# Patient Record
Sex: Male | Born: 1970 | Race: White | Hispanic: No | Marital: Married | Smoking: Never smoker
Health system: Southern US, Community
[De-identification: ages and names within clinical notes are randomized; demographics above are authoritative.]

## PROBLEM LIST (undated history)

## (undated) DIAGNOSIS — E785 Hyperlipidemia, unspecified: Secondary | ICD-10-CM

## (undated) DIAGNOSIS — IMO0002 Reserved for concepts with insufficient information to code with codable children: Secondary | ICD-10-CM

## (undated) DIAGNOSIS — I10 Essential (primary) hypertension: Secondary | ICD-10-CM

## (undated) DIAGNOSIS — E1165 Type 2 diabetes mellitus with hyperglycemia: Secondary | ICD-10-CM

## (undated) DIAGNOSIS — G4700 Insomnia, unspecified: Secondary | ICD-10-CM

## (undated) DIAGNOSIS — K219 Gastro-esophageal reflux disease without esophagitis: Secondary | ICD-10-CM

## (undated) DIAGNOSIS — T7840XA Allergy, unspecified, initial encounter: Secondary | ICD-10-CM

## (undated) HISTORY — DX: Gastro-esophageal reflux disease without esophagitis: K21.9

## (undated) HISTORY — DX: Hyperlipidemia, unspecified: E78.5

## (undated) HISTORY — DX: Reserved for concepts with insufficient information to code with codable children: IMO0002

## (undated) HISTORY — DX: Allergy, unspecified, initial encounter: T78.40XA

## (undated) HISTORY — DX: Essential (primary) hypertension: I10

## (undated) HISTORY — DX: Type 2 diabetes mellitus with hyperglycemia: E11.65

## (undated) HISTORY — DX: Insomnia, unspecified: G47.00

## (undated) HISTORY — PX: OTHER SURGICAL HISTORY: SHX169

---

## 2011-06-28 ENCOUNTER — Emergency Department: Payer: Self-pay | Admitting: Emergency Medicine

## 2011-06-28 LAB — CBC
MCH: 30.9 pg (ref 26.0–34.0)
MCHC: 33.9 g/dL (ref 32.0–36.0)
MCV: 91 fL (ref 80–100)
Platelet: 307 10*3/uL (ref 150–440)

## 2011-06-28 LAB — COMPREHENSIVE METABOLIC PANEL
Albumin: 4.4 g/dL (ref 3.4–5.0)
BUN: 14 mg/dL (ref 7–18)
Bilirubin,Total: 1.3 mg/dL — ABNORMAL HIGH (ref 0.2–1.0)
Calcium, Total: 9.6 mg/dL (ref 8.5–10.1)
Co2: 25 mmol/L (ref 21–32)
Glucose: 141 mg/dL — ABNORMAL HIGH (ref 65–99)
Osmolality: 282 (ref 275–301)
Potassium: 3.6 mmol/L (ref 3.5–5.1)
SGPT (ALT): 41 U/L
Sodium: 140 mmol/L (ref 136–145)

## 2011-06-28 LAB — LIPASE, BLOOD: Lipase: 179 U/L (ref 73–393)

## 2011-06-28 LAB — TROPONIN I: Troponin-I: <0.02 ng/mL

## 2013-02-07 DIAGNOSIS — M25569 Pain in unspecified knee: Secondary | ICD-10-CM | POA: Insufficient documentation

## 2013-03-24 ENCOUNTER — Ambulatory Visit: Payer: Self-pay | Admitting: Physician Assistant

## 2013-03-28 ENCOUNTER — Ambulatory Visit: Payer: Self-pay | Admitting: Physician Assistant

## 2013-04-07 ENCOUNTER — Ambulatory Visit: Payer: Self-pay | Admitting: Physician Assistant

## 2013-04-10 ENCOUNTER — Ambulatory Visit: Payer: Self-pay | Admitting: Family Medicine

## 2013-05-14 LAB — LIPID PANEL
BUN: 10
Cholesterol, Total: 231
Creat: 0.82
GLUCOSE: 136
HDL: 33 mg/dL — AB (ref 35–70)
LDL CALC: 119 mg/dL
POTASSIUM: 4.4 mmol/L
Sodium: 140
Triglycerides: 395

## 2013-09-14 LAB — HEMOGLOBIN A1C, FINGERSTICK
Hemoglobin A1C: 7.4
Microalb, Ur: 20

## 2014-01-17 LAB — HEMOGLOBIN A1C, FINGERSTICK

## 2014-03-18 ENCOUNTER — Ambulatory Visit: Payer: Self-pay | Admitting: Family Medicine

## 2014-04-04 LAB — LIPID PANEL
BUN: 8
Cholesterol, Total: 228
Creat: 0.81
Glucose: 172
HDL: 32 mg/dL — AB (ref 35–70)
Potassium: 4.4 mmol/L
Sodium: 140
Triglycerides: 550

## 2014-05-21 LAB — BASIC METABOLIC PANEL
BUN: 11 mg/dL (ref 4–21)
CREATININE: 1 mg/dL (ref 0.6–1.3)
Glucose: 228 mg/dL
POTASSIUM: 4.2 mmol/L (ref 3.4–5.3)
Sodium: 137 mmol/L (ref 137–147)

## 2014-05-21 LAB — LIPID PANEL
Cholesterol: 193 mg/dL (ref 0–200)
HDL: 33 mg/dL — AB (ref 35–70)
Total CK: 63 U/L (ref ?–195.0)
Triglycerides: 574 mg/dL — AB (ref 40–160)

## 2014-05-21 LAB — HEMOGLOBIN A1C: Hgb A1c MFr Bld: 8.7 % — AB (ref 4.0–6.0)

## 2014-05-21 LAB — HEPATIC FUNCTION PANEL: ALT: 38 U/L (ref 10–40)

## 2014-07-24 LAB — GLUCOSE (CC13): GLUCOSE: 380

## 2014-08-06 ENCOUNTER — Encounter: Payer: Self-pay | Admitting: *Deleted

## 2014-08-06 DIAGNOSIS — I10 Essential (primary) hypertension: Secondary | ICD-10-CM | POA: Insufficient documentation

## 2014-08-06 DIAGNOSIS — K219 Gastro-esophageal reflux disease without esophagitis: Secondary | ICD-10-CM | POA: Insufficient documentation

## 2014-08-06 DIAGNOSIS — G4700 Insomnia, unspecified: Secondary | ICD-10-CM | POA: Insufficient documentation

## 2014-08-06 DIAGNOSIS — E782 Mixed hyperlipidemia: Secondary | ICD-10-CM | POA: Insufficient documentation

## 2014-08-06 DIAGNOSIS — J302 Other seasonal allergic rhinitis: Secondary | ICD-10-CM | POA: Insufficient documentation

## 2014-08-06 DIAGNOSIS — E119 Type 2 diabetes mellitus without complications: Secondary | ICD-10-CM

## 2014-08-06 DIAGNOSIS — E1169 Type 2 diabetes mellitus with other specified complication: Secondary | ICD-10-CM | POA: Insufficient documentation

## 2014-08-06 DIAGNOSIS — E669 Obesity, unspecified: Secondary | ICD-10-CM | POA: Insufficient documentation

## 2014-08-08 ENCOUNTER — Encounter: Payer: Self-pay | Admitting: Family Medicine

## 2014-08-09 ENCOUNTER — Ambulatory Visit (INDEPENDENT_AMBULATORY_CARE_PROVIDER_SITE_OTHER): Payer: BC Managed Care – PPO | Admitting: Family Medicine

## 2014-08-09 ENCOUNTER — Encounter: Payer: Self-pay | Admitting: Family Medicine

## 2014-08-09 VITALS — BP 110/70 | HR 80 | Temp 98.7°F | Resp 16 | Wt 242.0 lb

## 2014-08-09 DIAGNOSIS — E782 Mixed hyperlipidemia: Secondary | ICD-10-CM

## 2014-08-09 DIAGNOSIS — E1165 Type 2 diabetes mellitus with hyperglycemia: Secondary | ICD-10-CM

## 2014-08-09 DIAGNOSIS — I10 Essential (primary) hypertension: Secondary | ICD-10-CM | POA: Diagnosis not present

## 2014-08-09 DIAGNOSIS — IMO0002 Reserved for concepts with insufficient information to code with codable children: Secondary | ICD-10-CM

## 2014-08-09 NOTE — Progress Notes (Signed)
   Patient: David Baldwin Male    DOB: 06/23/1970   44 y.o.   MRN: [id number] Visit Date: 08/09/2014  Today's Provider: Mila Merryonald Paulette Rockford, MD   No chief complaint on file.  Subjective:    Diabetes He presents for his follow-up diabetic visit. He has type 2 diabetes mellitus. Hypoglycemia symptoms include sleepiness. Pertinent negatives for hypoglycemia include no dizziness or headaches. Associated symptoms include fatigue and visual change. Pertinent negatives for diabetes include no chest pain. Current diabetic treatment includes oral agent (dual therapy). He is compliant with treatment all of the time. His weight is stable. His breakfast blood glucose range is generally 180-200 mg/dl.       Diabetes Mellitus Type II, Follow-up:   Lab Results  Component Value Date   HGBA1C 8.7* 05/21/2014    Last seen for diabetes 2 weeks ago seen by Nadine CountsBob. Management changes included starting Glipizide 10mg  bid. He reports good compliance with treatment. He is not having side effects.  Current symptoms include none and have been stable. Home blood sugar records: fasting range: 185  Episodes of hypoglycemia? no   Weight trend: stable  He states that urinary frequency has greatly improved since starting glipizide, but is still having a lot of trouble with blurry vision.   Pertinent Labs:    Component Value Date/Time   CHOL 193 05/21/2014   CHOL 228 04/04/2014   TRIG 574* 05/21/2014   TRIG 550 04/04/2014   CREATININE 1.0 05/21/2014   CREATININE 0.81 04/04/2014   CREATININE 0.91 06/28/2011 1922    Wt Readings from Last 3 Encounters:  07/24/14 235 lb 3.2 oz (106.686 kg)        Previous Medications   GLIPIZIDE (GLUCOTROL) 10 MG TABLET    Take 1 tablet by mouth. Take 1 Tablet Po 30 Minutes Before Breakfast And Supper   GLUCOSE BLOOD TEST STRIP    1 strip by Other route daily.   LISINOPRIL-HYDROCHLOROTHIAZIDE (PRINZIDE,ZESTORETIC) 20-25 MG PER TABLET    Take 1 tablet by mouth daily.   METFORMIN (GLUCOPHAGE-XR) 500 MG 24 HR TABLET    Take 1 tablet by mouth 2 (two) times daily.   NAPROXEN SODIUM 220 MG CAPS    Take 1 capsule by mouth daily as needed.   PRAVASTATIN (PRAVACHOL) 80 MG TABLET    Take 1 tablet by mouth daily.    Review of Systems  Constitutional: Positive for fatigue.  Cardiovascular: Negative for chest pain.  Neurological: Negative for dizziness and headaches.    History  Substance Use Topics  . Smoking status: Never Smoker   . Smokeless tobacco: Not on file  . Alcohol Use: No   Objective:   There were no vitals filed for this visit.  Physical Exam  Constitutional: He appears well-developed and well-nourished.  Cardiovascular: Normal rate and regular rhythm.   Pulmonary/Chest: Effort normal and breath sounds normal.        Assessment & Plan:     1. Diabetes type 2, uncontrolled Symptomatically improved on glipizide, but is not checking sugars during the day.   - ALT - Renal Function Panel - Hemoglobin A1c  2. Essential hypertension well controlled Continue current medications.    3. Hyperlipemia, mixed He is tolerating pravastatin well with no adverse effects.    - Lipid panel

## 2014-08-21 LAB — RENAL FUNCTION PANEL
ALBUMIN: 4.4 g/dL (ref 3.5–5.5)
BUN / CREAT RATIO: 13 (ref 9–20)
BUN: 10 mg/dL (ref 6–24)
CO2: 24 mmol/L (ref 18–29)
CREATININE: 0.78 mg/dL (ref 0.76–1.27)
Calcium: 10.3 mg/dL — ABNORMAL HIGH (ref 8.7–10.2)
Chloride: 99 mmol/L (ref 97–108)
GFR calc Af Amer: 126 mL/min/{1.73_m2} (ref >59)
GFR calc non Af Amer: 109 mL/min/{1.73_m2} (ref >59)
Glucose: 142 mg/dL — ABNORMAL HIGH (ref 65–99)
PHOSPHORUS: 3.4 mg/dL (ref 2.5–4.5)
Potassium: 4.2 mmol/L (ref 3.5–5.2)
Sodium: 141 mmol/L (ref 134–144)

## 2014-08-21 LAB — LIPID PANEL
Chol/HDL Ratio: 5.1 ratio units — ABNORMAL HIGH (ref 0.0–5.0)
Cholesterol, Total: 178 mg/dL (ref 100–199)
HDL: 35 mg/dL — ABNORMAL LOW (ref >39)
LDL CALC: 86 mg/dL (ref 0–99)
Triglycerides: 284 mg/dL — ABNORMAL HIGH (ref 0–149)
VLDL Cholesterol Cal: 57 mg/dL — ABNORMAL HIGH (ref 5–40)

## 2014-08-21 LAB — HEMOGLOBIN A1C
Est. average glucose Bld gHb Est-mCnc: 275 mg/dL
Hgb A1c MFr Bld: 11.2 % — ABNORMAL HIGH (ref 4.8–5.6)

## 2014-08-21 LAB — ALT: ALT: 35 IU/L (ref 0–44)

## 2014-08-22 ENCOUNTER — Telehealth: Payer: Self-pay | Admitting: Family Medicine

## 2014-08-22 NOTE — Telephone Encounter (Signed)
Pt contacted office for refill request on the following medications:glipiZIDE (GLUCOTROL) 10 MG tablet.  [facility] [address]  CB#[phone]/MJ

## 2014-08-23 MED ORDER — GLIPIZIDE 10 MG PO TABS: ORAL_TABLET | ORAL | Status: DC

## 2014-08-23 NOTE — Telephone Encounter (Signed)
done

## 2014-09-18 ENCOUNTER — Ambulatory Visit (INDEPENDENT_AMBULATORY_CARE_PROVIDER_SITE_OTHER): Payer: BC Managed Care – PPO | Admitting: Family Medicine

## 2014-09-18 ENCOUNTER — Encounter: Payer: Self-pay | Admitting: Family Medicine

## 2014-09-18 VITALS — BP 112/80 | HR 72 | Temp 98.5°F | Resp 16 | Wt 240.0 lb

## 2014-09-18 DIAGNOSIS — E1165 Type 2 diabetes mellitus with hyperglycemia: Secondary | ICD-10-CM | POA: Diagnosis not present

## 2014-09-18 DIAGNOSIS — E782 Mixed hyperlipidemia: Secondary | ICD-10-CM | POA: Diagnosis not present

## 2014-09-18 DIAGNOSIS — IMO0002 Reserved for concepts with insufficient information to code with codable children: Secondary | ICD-10-CM

## 2014-09-18 NOTE — Progress Notes (Signed)
Patient: David Baldwin Male    DOB: 1970/08/14   44 y.o.   MRN: [id number] Visit Date: 09/18/2014  Today's Provider: Mila Merry, MD   Chief Complaint  Patient presents with  . Diabetes  . Hyperlipidemia   Subjective:    Diabetes He presents for his follow-up diabetic visit. He has type 2 diabetes mellitus. His disease course has been improving. There are no hypoglycemic associated symptoms. Pertinent negatives for hypoglycemia include no dizziness, headaches, seizures, speech difficulty or tremors. Associated symptoms include blurred vision (Pt reports his vision has improved greatly. ), foot paresthesias, foot ulcerations and visual change. Pertinent negatives for diabetes include no chest pain, no fatigue, no polydipsia, no polyphagia, no polyuria, no weakness and no weight loss. Risk factors for coronary artery disease include diabetes mellitus, hypertension and dyslipidemia. Current diabetic treatment includes oral agent (dual therapy). He is compliant with treatment all of the time. He is following a diabetic diet. His home blood glucose trend is decreasing steadily. His overall blood glucose range is 140-180 mg/dl. He does not see a podiatrist.Eye exam is current.  Hyperlipidemia This is a chronic problem. The problem is uncontrolled. Recent lipid tests were reviewed and are high. Pertinent negatives include no chest pain or shortness of breath. There are no compliance problems.  Risk factors for coronary artery disease include diabetes mellitus, dyslipidemia and hypertension.   He started glipizide after last visit about a month ago when sugars were running in the upper 200s. He feels much better since starting glipizide with no hypoglycemia. His vision has stabilized and is back to baseline.     Lab Results  Component Value Date   HGBA1C 11.2* 08/20/2014   Lab Results  Component Value Date   CHOL 178 08/20/2014   HDL 35* 08/20/2014   LDLCALC 86 08/20/2014   TRIG  284* 08/20/2014   CHOLHDL 5.1* 08/20/2014    Allergies  Allergen Reactions  . Zolpidem Tartrate Other (See Comments)   Previous Medications   GLIPIZIDE (GLUCOTROL) 10 MG TABLET    Take 1 Tablet Before Breakfast And Supper   GLUCOSE BLOOD TEST STRIP    1 strip by Other route daily.   LISINOPRIL-HYDROCHLOROTHIAZIDE (PRINZIDE,ZESTORETIC) 20-25 MG PER TABLET    Take 1 tablet by mouth daily.   METFORMIN (GLUCOPHAGE-XR) 500 MG 24 HR TABLET    Take 1 tablet by mouth 2 (two) times daily.   NAPROXEN SODIUM 220 MG CAPS    Take 1 capsule by mouth daily as needed.   PRAVASTATIN (PRAVACHOL) 80 MG TABLET    Take 1 tablet by mouth daily.    Review of Systems  Constitutional: Negative for fever, chills, weight loss, diaphoresis, activity change, appetite change, fatigue and unexpected weight change.  Eyes: Positive for blurred vision (Pt reports his vision has improved greatly. ).  Respiratory: Negative for apnea, cough, choking, chest tightness, shortness of breath, wheezing and stridor.   Cardiovascular: Negative for chest pain and palpitations.  Gastrointestinal: Negative for nausea, vomiting, abdominal pain, diarrhea, constipation, blood in stool, abdominal distention, anal bleeding and rectal pain.  Endocrine: Negative for cold intolerance, heat intolerance, polydipsia, polyphagia and polyuria.  Neurological: Negative for dizziness, tremors, seizures, syncope, facial asymmetry, speech difficulty, weakness, light-headedness, numbness and headaches.    History  Substance Use Topics  . Smoking status: Never Smoker   . Smokeless tobacco: Not on file  . Alcohol Use: No   Objective:   BP 112/80 mmHg  Pulse 72  Temp(Src) 98.5 F (36.9 C) (Oral)  Resp 16  Wt 240 lb (108.863 kg)  Physical Exam   General Appearance:    Alert, cooperative, no distress  Eyes:    PERRL, conjunctiva/corneas clear, EOM's intact       Lungs:     Clear to auscultation bilaterally, respirations unlabored  Heart:     Regular rate and rhythm  Neurologic:   Awake, alert, oriented x 3. No apparent focal neurological           defect.           Assessment & Plan:      1. Hyperli2emia, mixed He is tolerating pravastatin well with no adverse effects.    2. Diabetes type 2, uncontrolled Home glucose much better since adding glipizide with improvement in sx. Continue current medications.  Recheck A1c at follow up in 2-3 months.   Follow up: Return in about 2 months (around 11/19/2014).      Mila Merryonald Fisher, MD  [facility]

## 2014-10-08 ENCOUNTER — Other Ambulatory Visit: Payer: Self-pay | Admitting: Family Medicine

## 2014-10-08 MED ORDER — GLIPIZIDE 10 MG PO TABS: ORAL_TABLET | ORAL | Status: DC

## 2014-10-08 MED ORDER — PRAVASTATIN SODIUM 80 MG PO TABS: 80.0000 mg | ORAL_TABLET | Freq: Every day | ORAL | Status: DC

## 2014-10-08 MED ORDER — METFORMIN HCL ER 500 MG PO TB24: 500.0000 mg | ORAL_TABLET | Freq: Two times a day (BID) | ORAL | Status: DC

## 2014-10-08 MED ORDER — LISINOPRIL-HYDROCHLOROTHIAZIDE 20-25 MG PO TABS: 1.0000 | ORAL_TABLET | Freq: Every day | ORAL | Status: DC

## 2014-11-07 ENCOUNTER — Ambulatory Visit: Payer: BC Managed Care – PPO | Admitting: Family Medicine

## 2014-11-18 ENCOUNTER — Encounter: Payer: Self-pay | Admitting: Family Medicine

## 2014-11-18 ENCOUNTER — Ambulatory Visit (INDEPENDENT_AMBULATORY_CARE_PROVIDER_SITE_OTHER): Payer: BC Managed Care – PPO | Admitting: Family Medicine

## 2014-11-18 VITALS — BP 120/78 | HR 84 | Temp 98.9°F | Resp 16 | Ht 68.0 in | Wt 246.0 lb

## 2014-11-18 DIAGNOSIS — E1165 Type 2 diabetes mellitus with hyperglycemia: Secondary | ICD-10-CM

## 2014-11-18 DIAGNOSIS — E782 Mixed hyperlipidemia: Secondary | ICD-10-CM

## 2014-11-18 DIAGNOSIS — I10 Essential (primary) hypertension: Secondary | ICD-10-CM | POA: Diagnosis not present

## 2014-11-18 DIAGNOSIS — IMO0002 Reserved for concepts with insufficient information to code with codable children: Secondary | ICD-10-CM

## 2014-11-18 LAB — POCT GLYCOSYLATED HEMOGLOBIN (HGB A1C)
ESTIMATED AVERAGE GLUCOSE: 143
HEMOGLOBIN A1C: 6.6

## 2014-11-18 MED ORDER — METFORMIN HCL ER 500 MG PO TB24: 500.0000 mg | ORAL_TABLET | Freq: Two times a day (BID) | ORAL | Status: DC

## 2014-11-18 MED ORDER — LISINOPRIL-HYDROCHLOROTHIAZIDE 20-25 MG PO TABS: 1.0000 | ORAL_TABLET | Freq: Every day | ORAL | Status: DC

## 2014-11-18 MED ORDER — PRAVASTATIN SODIUM 80 MG PO TABS: 80.0000 mg | ORAL_TABLET | Freq: Every day | ORAL | Status: DC

## 2014-11-18 MED ORDER — GLIPIZIDE 10 MG PO TABS: ORAL_TABLET | ORAL | Status: DC

## 2014-11-18 NOTE — Progress Notes (Signed)
Patient: David Baldwin Male    DOB: 06-28-1970   44 y.o.   MRN: [id number] Visit Date: 11/18/2014  Today's Provider: Mila Merry, MD   Chief Complaint  Patient presents with  . Diabetes    follow up   Subjective:    HPI      Diabetes Mellitus Type II, Follow-up:   Lab Results  Component Value Date   HGBA1C 6.6 11/18/2014   HGBA1C 11.2* 08/20/2014   HGBA1C 8.7* 05/21/2014    Last seen for diabetes 3 months ago.  He reports good compliance with treatment. He is not having side effects.  Current symptoms include hypoglycemia  and have been improving. Home blood sugar records: fasting range: 140's  Episodes of hypoglycemia? yes - blood sugar was 40 last week, but felt fine. No other low sugar readings.    Current Insulin Regimen:  none Most Recent Eye Exam:  > 1 year. [facility] Weight trend: stable Prior visit with dietician: no Current diet: in general, a "healthy" diet   Current exercise: walking (at work)  Pertinent Labs:    Component Value Date/Time   CHOL 178 08/20/2014 0814   CHOL 193 05/21/2014   CHOL 228 04/04/2014   TRIG 284* 08/20/2014 0814   TRIG 550 04/04/2014   CHOLHDL 5.1* 08/20/2014 0814   CREATININE 0.78 08/20/2014 0814   CREATININE 1.0 05/21/2014   CREATININE 0.81 04/04/2014   CREATININE 0.91 06/28/2011 1922    Wt Readings from Last 3 Encounters:  09/18/14 240 lb (108.863 kg)  08/09/14 242 lb (109.77 kg)  07/24/14 235 lb 3.2 oz (106.686 kg)    ------------------------------------------------------------------------   Hypertension, follow-up:  BP Readings from Last 3 Encounters:  11/18/14 120/78  09/18/14 112/80  08/09/14 110/70    He was last seen for hypertension 3 months ago.  BP at that visit was  112/80. Management since that visit includes  none. He reports good compliance with treatment. He is not having side effects.  He is exercising. He is adherent to low salt diet.   Outside blood pressures are   Normal per patient. He is experiencing none.  Patient denies chest pain, chest pressure/discomfort, claudication, dyspnea, exertional chest pressure/discomfort, fatigue, irregular heart beat, lower extremity edema, near-syncope, orthopnea, palpitations, paroxysmal nocturnal dyspnea, syncope and tachypnea.   Cardiovascular risk factors include dyslipidemia, hypertension and male gender.  Use of agents associated with hypertension: none.     Weight trend: stable Wt Readings from Last 3 Encounters:  11/18/14 246 lb (111.585 kg)  09/18/14 240 lb (108.863 kg)  08/09/14 242 lb (109.77 kg)    Current diet: in general, a "healthy" diet    ------------------------------------------------------------------------    Allergies  Allergen Reactions  . Zolpidem Tartrate Other (See Comments)   Previous Medications   GLIPIZIDE (GLUCOTROL) 10 MG TABLET    Take 1 Tablet Before Breakfast And Supper   GLUCOSE BLOOD TEST STRIP    1 strip by Other route daily.   LISINOPRIL-HYDROCHLOROTHIAZIDE (PRINZIDE,ZESTORETIC) 20-25 MG PER TABLET    Take 1 tablet by mouth daily.   METFORMIN (GLUCOPHAGE-XR) 500 MG 24 HR TABLET    Take 1 tablet (500 mg total) by mouth 2 (two) times daily.   NAPROXEN SODIUM 220 MG CAPS    Take 1 capsule by mouth daily as needed.   PRAVASTATIN (PRAVACHOL) 80 MG TABLET    Take 1 tablet (80 mg total) by mouth daily.    Review of Systems  Constitutional: Negative  for fever, chills and appetite change.  Respiratory: Negative for chest tightness, shortness of breath and wheezing.   Cardiovascular: Negative for chest pain and palpitations.  Gastrointestinal: Negative for nausea, vomiting and abdominal pain.    Social History  Substance Use Topics  . Smoking status: Never Smoker   . Smokeless tobacco: Not on file  . Alcohol Use: No   Objective:   BP 120/78 mmHg  Pulse 84  Temp(Src) 98.9 F (37.2 C) (Oral)  Resp 16  Ht  (1.727 m)  Wt 246 lb (111.585 kg)  BMI 37.41 kg/m2   SpO2 96%  Physical Exam  Results for orders placed or performed in visit on 11/18/14  POCT HgB A1C  Result Value Ref Range   Hemoglobin A1C 6.6    Est. average glucose Bld gHb Est-mCnc 143        Assessment & Plan:     1. Diabetes type 2, uncontrolled Greatly improved. Try to reduce glipizide to one every morning. Advised he may take the second dose in the evening if he sees evening sugars going up into the 200s.  - POCT HgB A1C - glipiZIDE (GLUCOTROL) 10 MG tablet; Take 1 Tablet Before Breakfast And one before Supper as needed  Dispense: 60 tablet; Refill: 5 - metFORMIN (GLUCOPHAGE-XR) 500 MG 24 hr tablet; Take 1 tablet (500 mg total) by mouth 2 (two) times daily.  Dispense: 60 tablet; Refill: 5  2. Hyperlipemia, mixed He is tolerating pravastatin well with no adverse effects.   - pravastatin (PRAVACHOL) 80 MG tablet; Take 1 tablet (80 mg total) by mouth daily.  Dispense: 30 tablet; Refill: 6  3. Essential hypertension well controlled. Continue current medications.   - lisinopril-hydrochlorothiazide (PRINZIDE,ZESTORETIC) 20-25 MG per tablet; Take 1 tablet by mouth daily.  Dispense: 30 tablet; Refill: 6       Mila Merry, MD  [facility]

## 2015-02-17 ENCOUNTER — Encounter: Payer: Self-pay | Admitting: Family Medicine

## 2015-02-17 ENCOUNTER — Ambulatory Visit (INDEPENDENT_AMBULATORY_CARE_PROVIDER_SITE_OTHER): Payer: BC Managed Care – PPO | Admitting: Family Medicine

## 2015-02-17 VITALS — BP 124/84 | HR 94 | Temp 99.3°F | Resp 16 | Ht 68.0 in | Wt 251.0 lb

## 2015-02-17 DIAGNOSIS — J069 Acute upper respiratory infection, unspecified: Secondary | ICD-10-CM | POA: Diagnosis not present

## 2015-02-17 DIAGNOSIS — R208 Other disturbances of skin sensation: Secondary | ICD-10-CM

## 2015-02-17 DIAGNOSIS — E1165 Type 2 diabetes mellitus with hyperglycemia: Secondary | ICD-10-CM

## 2015-02-17 DIAGNOSIS — IMO0001 Reserved for inherently not codable concepts without codable children: Secondary | ICD-10-CM

## 2015-02-17 DIAGNOSIS — R200 Anesthesia of skin: Secondary | ICD-10-CM

## 2015-02-17 LAB — POCT GLYCOSYLATED HEMOGLOBIN (HGB A1C)
ESTIMATED AVERAGE GLUCOSE: 151
HEMOGLOBIN A1C: 6.9

## 2015-02-17 NOTE — Progress Notes (Signed)
Patient: David Baldwin Male    DOB: 1971/01/25   44 y.o.   MRN: [id number] Visit Date: 02/17/2015  Today's Provider: Mila Merry, MD   Chief Complaint  Patient presents with  . Follow-up  . Diabetes  . Hyperlipidemia  . Hypertension   Subjective:    HPI   Diabetes Mellitus Type II, Follow-up:   Lab Results  Component Value Date   HGBA1C 6.6 11/18/2014   HGBA1C 11.2* 08/20/2014   HGBA1C 8.7* 05/21/2014   Last seen for diabetes 4 months ago.  Management since then includes; advised to try to reduce glipizide to 1 every morning. However he started having blurry vision on one glipizide a day so has since gone back up to  Twice a day.  He reports good compliance with treatment. He is not having side effects. none Current symptoms include none and have been stable. Home blood sugar records: fasting range: 140  Episodes of hypoglycemia? no   Current Insulin Regimen: n/a Most Recent Eye Exam: due Weight trend: stable Prior visit with dietician: no Current diet: well balanced Current exercise: none  ----------------------------------------------------------------------   Hypertension, follow-up:  BP Readings from Last 3 Encounters:  11/18/14 120/78  09/18/14 112/80  08/09/14 110/70    He was last seen for hypertension 4 months ago.  BP at that visit was 120/78. Management since that visit includes; no changes.He reports good compliance with treatment. He is not having side effects. none  He is not exercising. He is not adherent to low salt diet.   Outside blood pressures are 120/70. He is experiencing none.  Patient denies none.   Cardiovascular risk factors include diabetes Use of agents associated with hypertension: none.   ----------------------------------------------------------------------   URI Patient has had nasal and chest congestion since 02/08/2015. Has had low-grade fever. Feels much better over the last 3 days. Sore throat has  resolved  Left arm pain States his left arm starting hurting about 2 months ago. Not sure how he injured it. Has not been swollen, but feels weak and numb at times. Is slowly improving.     Allergies  Allergen Reactions  . Zolpidem Tartrate Other (See Comments)   Previous Medications   GLIPIZIDE (GLUCOTROL) 10 MG TABLET    Take 1 Tablet Before Breakfast And one before Supper as needed   GLUCOSE BLOOD TEST STRIP    1 strip by Other route daily.   LISINOPRIL-HYDROCHLOROTHIAZIDE (PRINZIDE,ZESTORETIC) 20-25 MG PER TABLET    Take 1 tablet by mouth daily.   METFORMIN (GLUCOPHAGE-XR) 500 MG 24 HR TABLET    Take 1 tablet (500 mg total) by mouth 2 (two) times daily.   NAPROXEN SODIUM 220 MG CAPS    Take 1 capsule by mouth daily as needed.   PRAVASTATIN (PRAVACHOL) 80 MG TABLET    Take 1 tablet (80 mg total) by mouth daily.    Review of Systems  Constitutional: Positive for fever. Negative for chills and appetite change.  HENT: Positive for congestion and postnasal drip. Negative for ear discharge, ear pain, sinus pressure and sore throat.   Respiratory: Positive for cough. Negative for chest tightness, shortness of breath and wheezing.   Cardiovascular: Negative for chest pain and palpitations.  Gastrointestinal: Negative for nausea, vomiting and abdominal pain.    Social History  Substance Use Topics  . Smoking status: Never Smoker   . Smokeless tobacco: Not on file  . Alcohol Use: No   Objective:   BP 124/84  mmHg  Pulse 94  Temp(Src) 99.3 F (37.4 C) (Oral)  Resp 16  Ht 5\' 8"  (1.727 m)  Wt 251 lb (113.853 kg)  BMI 38.17 kg/m2  SpO2 97%  Physical Exam  General Appearance:    Alert, cooperative, no distress  HENT:   bilateral TM normal without fluid or infection, neck without nodes, sinuses nontender and nasal mucosa congested  Eyes:    PERRL, conjunctiva/corneas clear, EOM's intact       Lungs:     Clear to auscultation bilaterally, respirations unlabored  Heart:    Regular  rate and rhythm  Neurologic:   Awake, alert, oriented x 3. No apparent focal neurological           defect.         Results for orders placed or performed in visit on 02/17/15  POCT glycosylated hemoglobin (Hb A1C)  Result Value Ref Range   Hemoglobin A1C 6.9    Est. average glucose Bld gHb Est-mCnc 151        Assessment & Plan:     1. Controlled type 2 diabetes mellitus without complication, without long-term current use of insulin (HCC) Doing well current medications.  - POCT glycosylated hemoglobin (Hb A1C)  2. Upper respiratory infection Improving.  Counseled regarding signs and symptoms of viral and bacterial respiratory infections. Advised to call or return for additional evaluation if he develops any sign of bacterial infection, or if current symptoms last longer than 10 days.    3. Left arm numbness Slowly improving. Advised to call for orthopedic referral if not completely better within a month  Follow up  4 months.        Mila Merryonald Aadith Raudenbush, MD  [facility]

## 2015-05-13 LAB — HM DIABETES EYE EXAM

## 2015-05-15 ENCOUNTER — Encounter: Payer: Self-pay | Admitting: *Deleted

## 2015-05-29 ENCOUNTER — Other Ambulatory Visit: Payer: Self-pay | Admitting: Family Medicine

## 2015-05-29 DIAGNOSIS — E782 Mixed hyperlipidemia: Secondary | ICD-10-CM

## 2015-05-29 MED ORDER — GLIPIZIDE 10 MG PO TABS: ORAL_TABLET | ORAL | Status: DC

## 2015-05-29 MED ORDER — PRAVASTATIN SODIUM 80 MG PO TABS: 80.0000 mg | ORAL_TABLET | Freq: Every day | ORAL | Status: DC

## 2015-05-29 NOTE — Telephone Encounter (Signed)
Pt contacted office for refill request on the following medications: 90 day supply.  [facility] [address]  CB#[phone]/[place]  glipiZIDE (GLUCOTROL) 10 MG tablet  pravastatin (PRAVACHOL) 80 MG tablet

## 2015-05-29 NOTE — Telephone Encounter (Signed)
Requesting 90 day supply.

## 2015-06-23 ENCOUNTER — Ambulatory Visit: Payer: BC Managed Care – PPO | Admitting: Family Medicine

## 2015-06-23 ENCOUNTER — Encounter: Payer: Self-pay | Admitting: Family Medicine

## 2015-06-23 ENCOUNTER — Ambulatory Visit (INDEPENDENT_AMBULATORY_CARE_PROVIDER_SITE_OTHER): Payer: BC Managed Care – PPO | Admitting: Family Medicine

## 2015-06-23 VITALS — BP 104/80 | HR 87 | Temp 98.2°F | Resp 16 | Ht 68.0 in | Wt 254.0 lb

## 2015-06-23 DIAGNOSIS — E782 Mixed hyperlipidemia: Secondary | ICD-10-CM | POA: Diagnosis not present

## 2015-06-23 DIAGNOSIS — E1165 Type 2 diabetes mellitus with hyperglycemia: Secondary | ICD-10-CM

## 2015-06-23 DIAGNOSIS — I10 Essential (primary) hypertension: Secondary | ICD-10-CM

## 2015-06-23 DIAGNOSIS — G4700 Insomnia, unspecified: Secondary | ICD-10-CM

## 2015-06-23 DIAGNOSIS — IMO0001 Reserved for inherently not codable concepts without codable children: Secondary | ICD-10-CM

## 2015-06-23 LAB — POCT GLYCOSYLATED HEMOGLOBIN (HGB A1C)
ESTIMATED AVERAGE GLUCOSE: 166
Hemoglobin A1C: 7.4

## 2015-06-23 MED ORDER — LORAZEPAM 1 MG PO TABS: 1.0000 mg | ORAL_TABLET | Freq: Every evening | ORAL | Status: DC | PRN

## 2015-06-23 MED ORDER — LISINOPRIL-HYDROCHLOROTHIAZIDE 20-25 MG PO TABS: 1.0000 | ORAL_TABLET | Freq: Every day | ORAL | Status: DC

## 2015-06-23 MED ORDER — METFORMIN HCL ER 500 MG PO TB24: 500.0000 mg | ORAL_TABLET | Freq: Two times a day (BID) | ORAL | Status: DC

## 2015-06-23 MED ORDER — LORAZEPAM 1 MG PO TABS: 1.0000 mg | ORAL_TABLET | Freq: Two times a day (BID) | ORAL | Status: DC | PRN

## 2015-06-23 NOTE — Progress Notes (Signed)
Patient: David Baldwin Male    DOB: 04/25/1970   45 y.o.   MRN: [id number] Visit Date: 06/23/2015  Today's Provider: Mila Merryonald Fisher, MD   Chief Complaint  Patient presents with  . Follow-up  . Diabetes  . Hypertension  . Hyperlipidemia   Subjective:    HPI   Diabetes Mellitus Type II, Follow-up:   Lab Results  Component Value Date   HGBA1C 6.9 02/17/2015   HGBA1C 6.6 11/18/2014   HGBA1C 11.2* 08/20/2014   Last seen for diabetes 4 months ago.  Management since then includes; no changes. He reports fair compliance with treatment. He is not having side effects. none Current symptoms include none and have been stable. Home blood sugar records: fasting range: none  Episodes of hypoglycemia? no   Current Insulin Regimen: n/a Most Recent Eye Exam: 3/17  Weight trend: stable Prior visit with dietician: no Current diet: in general, a "healthy" diet   Current exercise: walking  ----------------------------------------------------------------------     Hypertension, follow-up:  BP Readings from Last 3 Encounters:  06/23/15 104/80  02/17/15 124/84  11/18/14 120/78    He was last seen for hypertension 7 months ago.  BP at that visit was 120/78. Management since that visit includes; no changes.He reports good compliance with treatment. He is not having side effects. none  He is exercising. He is adherent to low salt diet.   Outside blood pressures are normal. He is experiencing none.  Patient denies none.   Cardiovascular risk factors include diabetes mellitus.  Use of agents associated with hypertension: none.   ----------------------------------------------------------------------     Lipid/Cholesterol, Follow-up:   Last seen for this 7 months ago.  Management since that visit includes; no changes.  Last Lipid Panel:    Component Value Date/Time   CHOL 178 08/20/2014 0814   CHOL 193 05/21/2014   CHOL 228 04/04/2014   TRIG 284* 08/20/2014  0814   TRIG 550 04/04/2014   HDL 35* 08/20/2014 0814   HDL 33* 05/21/2014   CHOLHDL 5.1* 08/20/2014 0814   LDLCALC 86 08/20/2014 0814   LDLCALC 119 05/14/2013    He reports good compliance with treatment. He is not having side effects. none  Wt Readings from Last 3 Encounters:  06/23/15 254 lb (115.214 kg)  02/17/15 251 lb (113.853 kg)  11/18/14 246 lb (111.585 kg)    ----------------------------------------------------------------------  Insomnia He would like to try a different medication to help wind down and sleep at night. He tried Ambien in the past which he did not tolerate. He has tried several OTC medications including melatonin, which either did not work or made him groggy the next morning.    Allergies  Allergen Reactions  . Zolpidem Tartrate Other (See Comments)   Previous Medications   GLIPIZIDE (GLUCOTROL) 10 MG TABLET    Take 1 Tablet Before Breakfast And one before Supper as needed   GLUCOSE BLOOD TEST STRIP    1 strip by Other route daily.   LISINOPRIL-HYDROCHLOROTHIAZIDE (PRINZIDE,ZESTORETIC) 20-25 MG PER TABLET    Take 1 tablet by mouth daily.   METFORMIN (GLUCOPHAGE-XR) 500 MG 24 HR TABLET    Take 1 tablet (500 mg total) by mouth 2 (two) times daily.   NAPROXEN SODIUM 220 MG CAPS    Take 1 capsule by mouth daily as needed.   PRAVASTATIN (PRAVACHOL) 80 MG TABLET    Take 1 tablet (80 mg total) by mouth daily.    Review of Systems  Constitutional:  Negative for fever, chills and appetite change.  Respiratory: Negative for chest tightness, shortness of breath and wheezing.   Cardiovascular: Negative for chest pain and palpitations.  Gastrointestinal: Negative for nausea, vomiting and abdominal pain.    Social History  Substance Use Topics  . Smoking status: Never Smoker   . Smokeless tobacco: Not on file  . Alcohol Use: No   Objective:   BP 104/80 mmHg  Pulse 87  Temp(Src) 98.2 F (36.8 C) (Oral)  Resp 16  Ht  (1.727 m)  Wt 254 lb (115.214  kg)  BMI 38.63 kg/m2  SpO2 96%  Physical Exam   General Appearance:    Alert, cooperative, no distress  Eyes:    PERRL, conjunctiva/corneas clear, EOM's intact       Lungs:     Clear to auscultation bilaterally, respirations unlabored  Heart:    Regular rate and rhythm  Neurologic:   Awake, alert, oriented x 3. No apparent focal neurological           defect.       .  Results for orders placed or performed in visit on 06/23/15  POCT glycosylated hemoglobin (Hb A1C)  Result Value Ref Range   Hemoglobin A1C 7.4    Est. average glucose Bld gHb Est-mCnc 166        Assessment & Plan:     1. Uncontrolled type 2 diabetes mellitus without complication, without long-term current use of insulin (HCC) A1c and weight are up a bit. He is going to work on improving diet exercising more and losing weight and reassess in 3 months.  - POCT glycosylated hemoglobin (Hb A1C) - metFORMIN (GLUCOPHAGE-XR) 500 MG 24 hr tablet; Take 1 tablet (500 mg total) by mouth 2 (two) times daily.  Dispense: 60 tablet; Refill: 5  2. Essential hypertension Well controlled.  Continue current medications.   - lisinopril-hydrochlorothiazide (PRINZIDE,ZESTORETIC) 20-25 MG tablet; Take 1 tablet by mouth daily.  Dispense: 30 tablet; Refill: 6  3. Insomnia Try lorazepam  - LORazepam (ATIVAN) 1 MG tablet; Take 1 tablet (1 mg total) by mouth at bedtime as needed for sleep.  Dispense: 20 tablet; Refill: 1  4. Hyperlipemia, mixed He is tolerating pravastatin well with no adverse effects.         Mila Merry, MD  [facility]

## 2015-09-25 ENCOUNTER — Ambulatory Visit (INDEPENDENT_AMBULATORY_CARE_PROVIDER_SITE_OTHER): Payer: BC Managed Care – PPO | Admitting: Family Medicine

## 2015-09-25 ENCOUNTER — Encounter: Payer: Self-pay | Admitting: Family Medicine

## 2015-09-25 VITALS — BP 130/90 | HR 80 | Temp 98.7°F | Resp 16 | Wt 254.0 lb

## 2015-09-25 DIAGNOSIS — E785 Hyperlipidemia, unspecified: Secondary | ICD-10-CM

## 2015-09-25 DIAGNOSIS — IMO0001 Reserved for inherently not codable concepts without codable children: Secondary | ICD-10-CM

## 2015-09-25 DIAGNOSIS — I10 Essential (primary) hypertension: Secondary | ICD-10-CM | POA: Diagnosis not present

## 2015-09-25 DIAGNOSIS — G4700 Insomnia, unspecified: Secondary | ICD-10-CM | POA: Diagnosis not present

## 2015-09-25 DIAGNOSIS — E1165 Type 2 diabetes mellitus with hyperglycemia: Secondary | ICD-10-CM

## 2015-09-25 LAB — POCT UA - MICROALBUMIN: Microalbumin Ur, POC: 20 mg/L

## 2015-09-25 LAB — POCT GLYCOSYLATED HEMOGLOBIN (HGB A1C)
Est. average glucose Bld gHb Est-mCnc: 220
HEMOGLOBIN A1C: 9.3

## 2015-09-25 MED ORDER — METFORMIN HCL ER 500 MG PO TB24: 1000.0000 mg | ORAL_TABLET | Freq: Two times a day (BID) | ORAL | Status: DC

## 2015-09-25 MED ORDER — LORAZEPAM 1 MG PO TABS: 0.5000 mg | ORAL_TABLET | Freq: Every evening | ORAL | Status: DC | PRN

## 2015-09-25 NOTE — Progress Notes (Signed)
Patient: David Baldwin Male    DOB: 1971/01/04   45 y.o.   MRN: [id number] Visit Date: 09/25/2015  Today's Provider: Mila Merry, MD   Chief Complaint  Patient presents with  . Hypertension    follow up  . Diabetes    follow up  . Hyperlipidemia    follow up  . Insomnia    follow up   Subjective:    HPI  Hypertension, follow-up:  BP Readings from Last 3 Encounters:  06/23/15 104/80  02/17/15 124/84  11/18/14 120/78    He was last seen for hypertension 3 months ago.  BP at that visit was 104/80. Management since that visit includes no changes. He reports good compliance with treatment. He is not having side effects.  He is exercising. He is adherent to low salt diet.   Outside blood pressures are not being checked. He is experiencing none.  Patient denies chest pain, chest pressure/discomfort, claudication, dyspnea, exertional chest pressure/discomfort, fatigue, irregular heart beat, lower extremity edema, near-syncope, orthopnea, palpitations, paroxysmal nocturnal dyspnea, syncope and tachypnea.   Cardiovascular risk factors include diabetes mellitus, dyslipidemia, hypertension and male gender.  Use of agents associated with hypertension: none.     Weight trend: stable Wt Readings from Last 3 Encounters:  09/25/15 254 lb (115.214 kg)  06/23/15 254 lb (115.214 kg)  02/17/15 251 lb (113.853 kg)    Current diet: in general, a "healthy" diet    ------------------------------------------------------------------------   Diabetes Mellitus Type II, Follow-up:   Lab Results  Component Value Date   HGBA1C 7.4 06/23/2015   HGBA1C 6.9 02/17/2015   HGBA1C 6.6 11/18/2014    Last seen for diabetes 3 months ago.  Management since then includes counseling patient to improve diet and exercise. He reports good compliance with treatment. He is not having side effects.  Current symptoms include none and have been stable. Home blood sugar records: not being  checked  Episodes of hypoglycemia? no   Current Insulin Regimen: none Most Recent Eye Exam: < 1 year Weight trend: stable Prior visit with dietician: no Current diet: in general, a "healthy" diet   Current exercise: walking  Pertinent Labs:    Component Value Date/Time   CHOL 178 08/20/2014 0814   CHOL 193 05/21/2014   CHOL 228 04/04/2014   TRIG 284* 08/20/2014 0814   TRIG 550 04/04/2014   HDL 35* 08/20/2014 0814   HDL 33* 05/21/2014   LDLCALC 86 08/20/2014 0814   LDLCALC 119 05/14/2013   CREATININE 0.78 08/20/2014 0814   CREATININE 1.0 05/21/2014   CREATININE 0.81 04/04/2014   CREATININE 0.91 06/28/2011 1922    Wt Readings from Last 3 Encounters:  09/25/15 254 lb (115.214 kg)  06/23/15 254 lb (115.214 kg)  02/17/15 251 lb (113.853 kg)    ------------------------------------------------------------------------   Lipid/Cholesterol, Follow-up:   Last seen for this3 months ago.  Management changes since that visit include no changes. . Last Lipid Panel:    Component Value Date/Time   CHOL 178 08/20/2014 0814   CHOL 193 05/21/2014   CHOL 228 04/04/2014   TRIG 284* 08/20/2014 0814   TRIG 550 04/04/2014   HDL 35* 08/20/2014 0814   HDL 33* 05/21/2014   CHOLHDL 5.1* 08/20/2014 0814   LDLCALC 86 08/20/2014 0814   LDLCALC 119 05/14/2013    Risk factors for vascular disease include diabetes mellitus, hypercholesterolemia and hypertension  He reports good compliance with treatment. He is not having side effects.  Current symptoms  include none and have been stable. Weight trend: stable Prior visit with dietician: no Current diet: in general, a "healthy" diet   Current exercise: walking  Wt Readings from Last 3 Encounters:  09/25/15 254 lb (115.214 kg)  06/23/15 254 lb (115.214 kg)  02/17/15 251 lb (113.853 kg)    ------------------------------------------------------------------- Follow up Insomnia:  Patient was last seen for this problem 3 months ago.  Changes made during this visit includes starting Lorazepam. Patient comes in today stating he has been out of Lorazepam for a couple of months. He reports when he was taking it, it worked well.     Allergies  Allergen Reactions  . Zolpidem Tartrate Other (See Comments)   Current Meds  Medication Sig  . glipiZIDE (GLUCOTROL) 10 MG tablet Take 1 Tablet Before Breakfast And one before Supper as needed  . glucose blood test strip 1 strip by Other route daily.  Marland Kitchen lisinopril-hydrochlorothiazide (PRINZIDE,ZESTORETIC) 20-25 MG tablet Take 1 tablet by mouth daily.  Marland Kitchen LORazepam (ATIVAN) 1 MG tablet Take 1 tablet (1 mg total) by mouth at bedtime as needed for sleep.  . metFORMIN (GLUCOPHAGE-XR) 500 MG 24 hr tablet Take 1 tablet (500 mg total) by mouth 2 (two) times daily.  . Naproxen Sodium 220 MG CAPS Take 1 capsule by mouth daily as needed.  . pravastatin (PRAVACHOL) 80 MG tablet Take 1 tablet (80 mg total) by mouth daily.    Review of Systems  Constitutional: Negative for fever, chills and appetite change.  Respiratory: Negative for chest tightness, shortness of breath and wheezing.   Cardiovascular: Negative for chest pain and palpitations.  Gastrointestinal: Negative for nausea, vomiting and abdominal pain.  Endocrine: Positive for polyuria. Negative for cold intolerance, heat intolerance, polydipsia and polyphagia.  Psychiatric/Behavioral: Positive for sleep disturbance.    Social History  Substance Use Topics  . Smoking status: Never Smoker   . Smokeless tobacco: Not on file  . Alcohol Use: No   Objective:   BP 130/90 mmHg  Pulse 80  Temp(Src) 98.7 F (37.1 C) (Oral)  Resp 16  Wt 254 lb (115.214 kg)  SpO2 96%  Physical Exam   General Appearance:    Alert, cooperative, no distress, obese  Eyes:    PERRL, conjunctiva/corneas clear, EOM's intact       Lungs:     Clear to auscultation bilaterally, respirations unlabored  Heart:    Regular rate and rhythm  Neurologic:   Awake,  alert, oriented x 3. No apparent focal neurological           defect.        Results for orders placed or performed in visit on 09/25/15  POCT HgB A1C  Result Value Ref Range   Hemoglobin A1C 9.3    Est. average glucose Bld gHb Est-mCnc 220   POCT UA - Microalbumin  Result Value Ref Range   Microalbumin Ur, POC 20 mg/L   Creatinine, POC n/a mg/dL   Albumin/Creatinine Ratio, Urine, POC n/a        Assessment & Plan:     1. Uncontrolled type 2 diabetes mellitus without complication, without long-term current use of insulin (HCC) Increase metformin from 1 bid to 2 bid. Counseled to check blood sugars more frequently, at least three times a week.   - POCT HgB A1C - POCT UA - Microalbumin - metFORMIN (GLUCOPHAGE-XR) 500 MG 24 hr tablet; Take 2 tablets (1,000 mg total) by mouth 2 (two) times daily. Please place prescription on hold until patient  calls for refill  Dispense: 120 tablet; Refill: 5 - Renal function panel  2. Insomnia Does well with lorazepam which we refilled today.  - LORazepam (ATIVAN) 1 MG tablet; Take 0.5-1 tablets (0.5-1 mg total) by mouth at bedtime as needed for sleep.  Dispense: 30 tablet; Refill: 2  3. Hyperlipidemia He is tolerating pravastatin well with no adverse effects.   - Lipid panel - Hepatic function panel  4. Essential hypertension Well controlled.  Continue current medications.    Return in about 3 months (around 12/26/2015).     The entirety of the information documented in the History of Present Illness, Review of Systems and Physical Exam were personally obtained by me. Portions of this information were initially documented by Awilda Billoshena Chambers, CMA and reviewed by me for thoroughness and accuracy.    Mila Merryonald Nemesio Castrillon, MD  [facility]

## 2015-09-26 LAB — HEPATIC FUNCTION PANEL
ALT: 36 IU/L (ref 0–44)
AST: 26 IU/L (ref 0–40)
Alkaline Phosphatase: 54 IU/L (ref 39–117)
BILIRUBIN TOTAL: 0.6 mg/dL (ref 0.0–1.2)
Bilirubin, Direct: 0.16 mg/dL (ref 0.00–0.40)
TOTAL PROTEIN: 7.3 g/dL (ref 6.0–8.5)

## 2015-09-26 LAB — RENAL FUNCTION PANEL
Albumin: 4.4 g/dL (ref 3.5–5.5)
BUN / CREAT RATIO: 15 (ref 9–20)
BUN: 11 mg/dL (ref 6–24)
CALCIUM: 9.9 mg/dL (ref 8.7–10.2)
CHLORIDE: 99 mmol/L (ref 96–106)
CO2: 21 mmol/L (ref 18–29)
CREATININE: 0.72 mg/dL — AB (ref 0.76–1.27)
GFR calc Af Amer: 128 mL/min/{1.73_m2} (ref >59)
GFR calc non Af Amer: 111 mL/min/{1.73_m2} (ref >59)
Glucose: 154 mg/dL — ABNORMAL HIGH (ref 65–99)
POTASSIUM: 4 mmol/L (ref 3.5–5.2)
Phosphorus: 3.9 mg/dL (ref 2.5–4.5)
SODIUM: 141 mmol/L (ref 134–144)

## 2015-09-26 LAB — LIPID PANEL
CHOL/HDL RATIO: 4.8 ratio (ref 0.0–5.0)
Cholesterol, Total: 155 mg/dL (ref 100–199)
HDL: 32 mg/dL — ABNORMAL LOW (ref >39)
LDL Calculated: 50 mg/dL (ref 0–99)
TRIGLYCERIDES: 363 mg/dL — AB (ref 0–149)
VLDL Cholesterol Cal: 73 mg/dL — ABNORMAL HIGH (ref 5–40)

## 2015-12-01 ENCOUNTER — Other Ambulatory Visit: Payer: Self-pay | Admitting: Family Medicine

## 2015-12-01 MED ORDER — GLIPIZIDE 10 MG PO TABS: ORAL_TABLET | ORAL | 0 refills | Status: DC

## 2015-12-01 NOTE — Telephone Encounter (Signed)
Pt needs refill on his glipazide.  [facility] [address]  Call back is [phone]  Thanks Barth Kirkseri

## 2015-12-01 NOTE — Telephone Encounter (Signed)
RX filled-aa 

## 2015-12-21 IMAGING — CR DG CHEST 2V
1 series · 2 of 2 positions shown · non-contrast
Comparison: 06/28/2011

CLINICAL DATA: Severe cough for 10 days.

EXAM:
CHEST  2 VIEW

[Series 1: kdxr chest pa (or ap) and lat · 0.14mm/px · 2 of 2 slices shown]
[im 1/2]
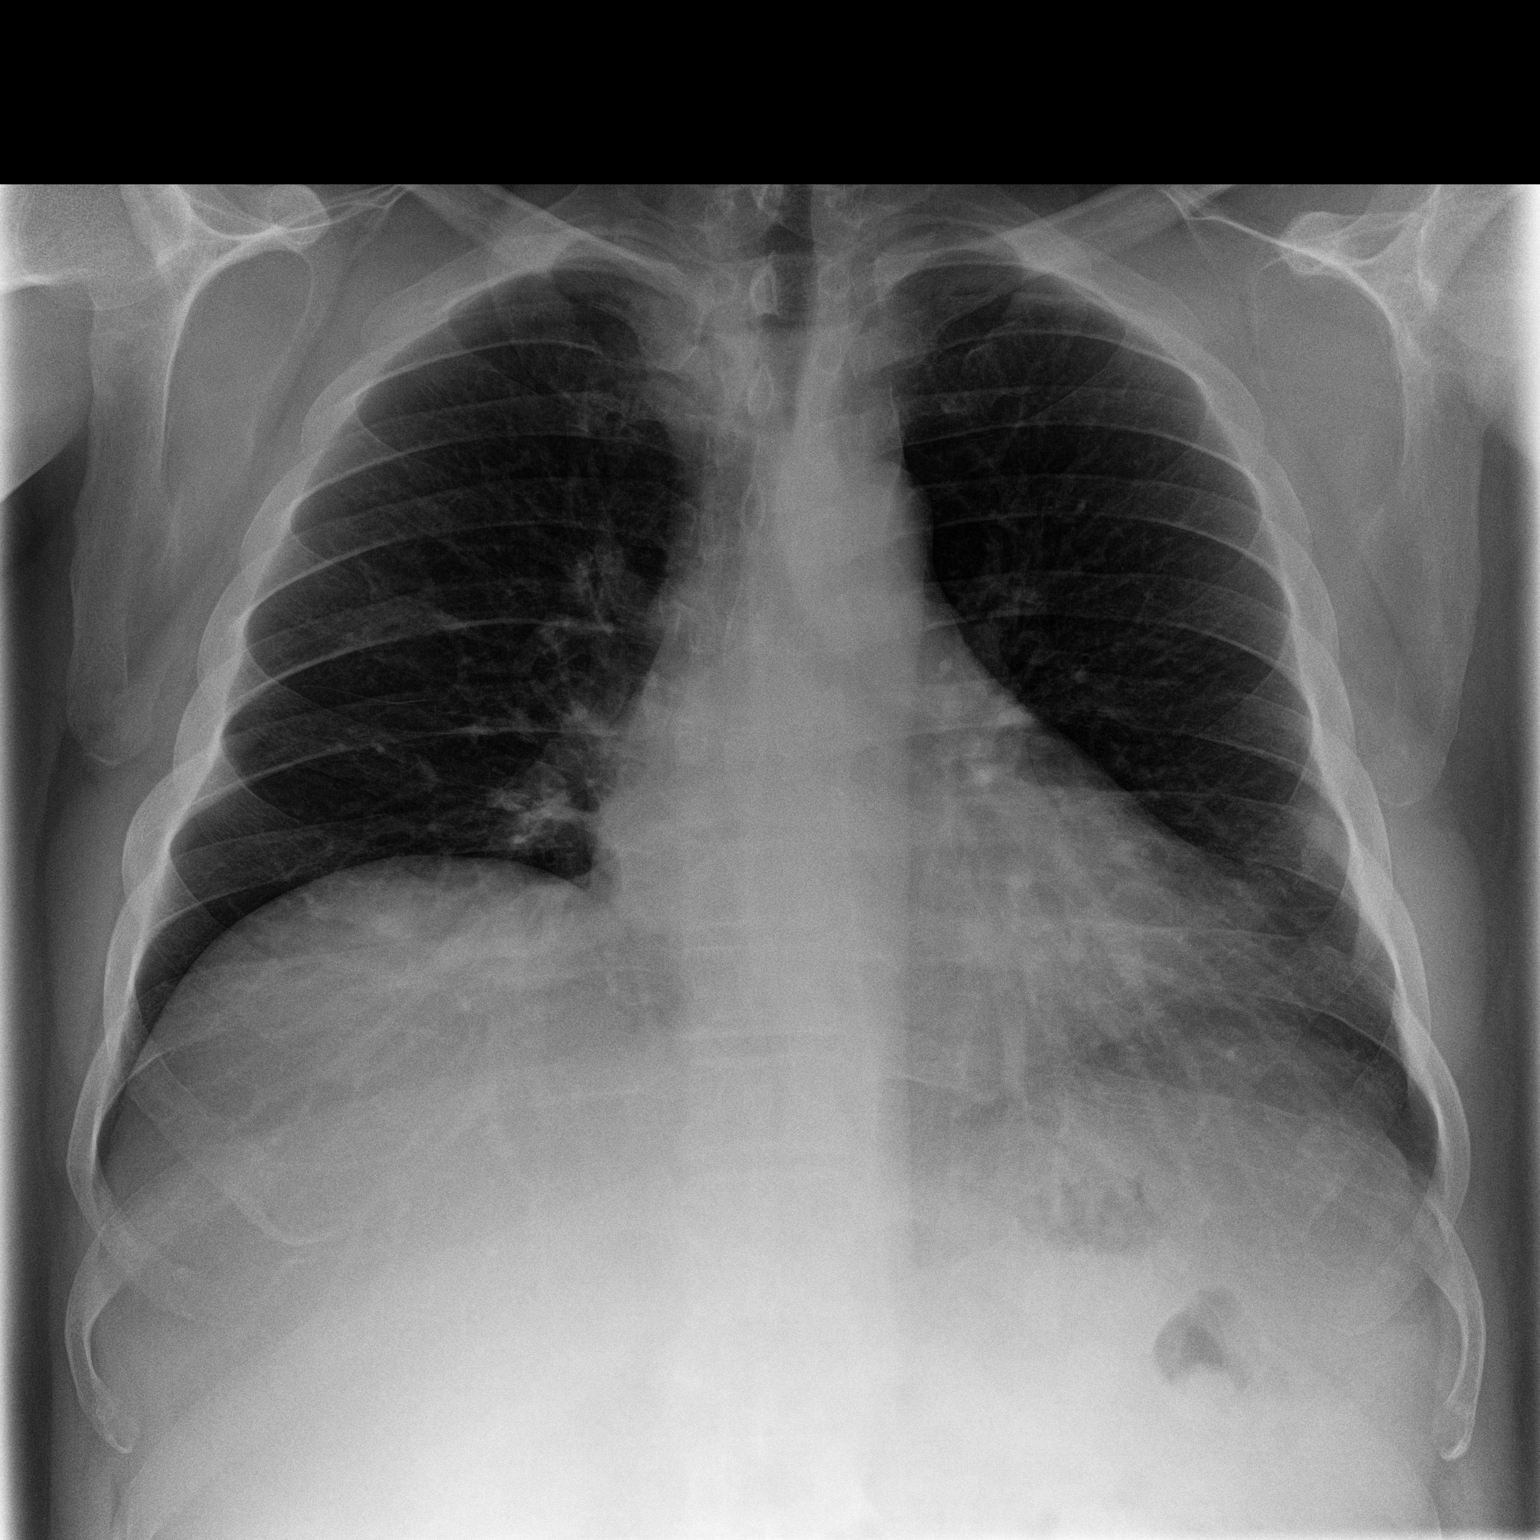
[im 2/2]
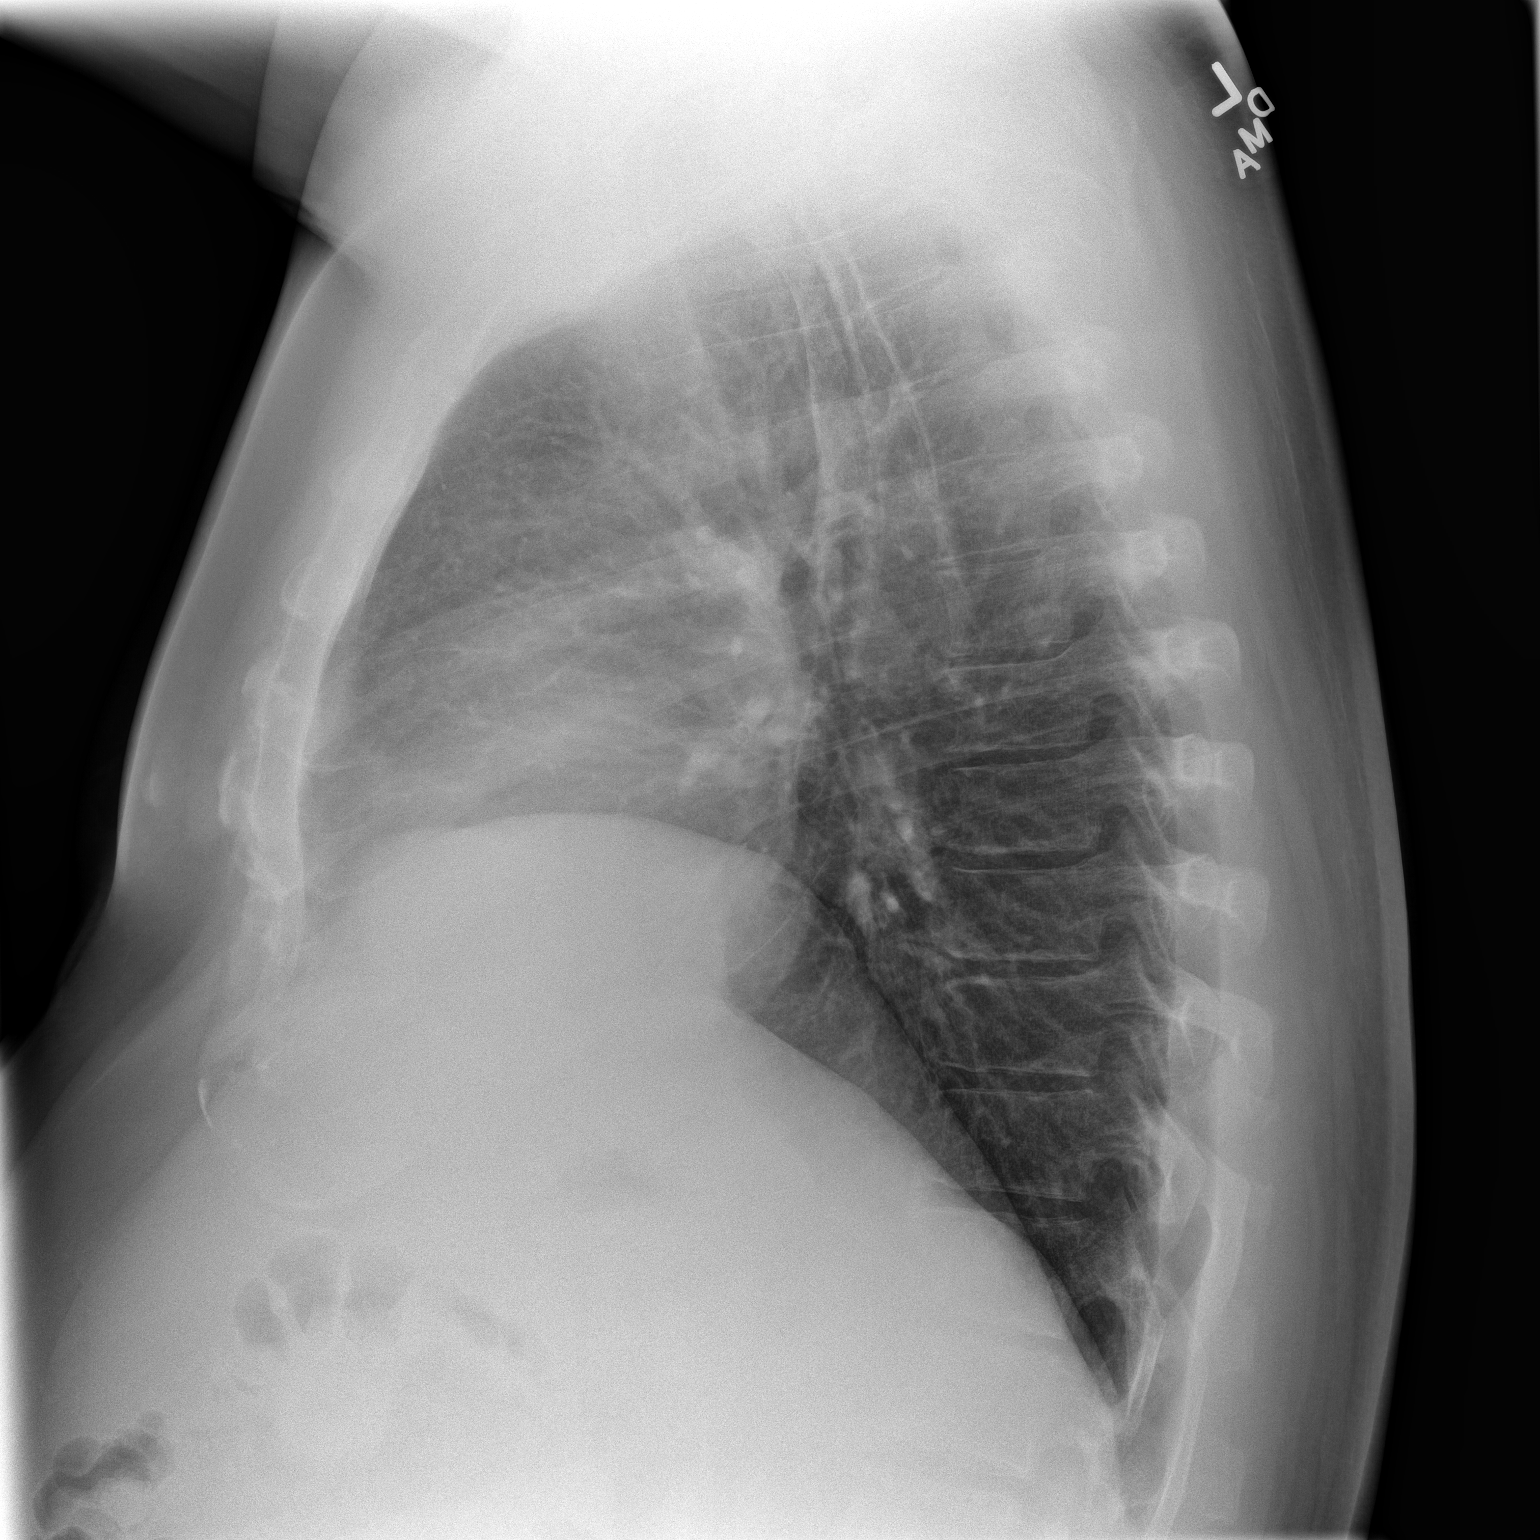

[2 of 2 positions shown; findings below may reference images not displayed]

FINDINGS: The heart size and mediastinal contours are within normal limits.
Both lungs are clear. The visualized skeletal structures are
unremarkable.
IMPRESSION: No active cardiopulmonary disease.

## 2016-01-06 ENCOUNTER — Ambulatory Visit (INDEPENDENT_AMBULATORY_CARE_PROVIDER_SITE_OTHER): Payer: BC Managed Care – PPO | Admitting: Family Medicine

## 2016-01-06 ENCOUNTER — Other Ambulatory Visit: Payer: Self-pay | Admitting: Family Medicine

## 2016-01-06 ENCOUNTER — Encounter: Payer: Self-pay | Admitting: Family Medicine

## 2016-01-06 VITALS — BP 126/82 | HR 84 | Temp 99.3°F | Resp 16 | Ht 68.0 in | Wt 250.0 lb

## 2016-01-06 DIAGNOSIS — F5101 Primary insomnia: Secondary | ICD-10-CM

## 2016-01-06 DIAGNOSIS — IMO0001 Reserved for inherently not codable concepts without codable children: Secondary | ICD-10-CM

## 2016-01-06 DIAGNOSIS — E1165 Type 2 diabetes mellitus with hyperglycemia: Secondary | ICD-10-CM | POA: Diagnosis not present

## 2016-01-06 LAB — POCT GLYCOSYLATED HEMOGLOBIN (HGB A1C)
Est. average glucose Bld gHb Est-mCnc: 209
HEMOGLOBIN A1C: 8.9

## 2016-01-06 MED ORDER — LORAZEPAM 1 MG PO TABS: 0.5000 mg | ORAL_TABLET | Freq: Every evening | ORAL | 2 refills | Status: DC | PRN

## 2016-01-06 MED ORDER — DAPAGLIFLOZIN PROPANEDIOL 10 MG PO TABS: 10.0000 mg | ORAL_TABLET | ORAL | 1 refills | Status: DC

## 2016-01-06 MED ORDER — DAPAGLIFLOZIN PROPANEDIOL 5 MG PO TABS
5.0000 mg | ORAL_TABLET | ORAL | 1 refills | Status: DC
Start: 2016-01-06 — End: 2016-03-16

## 2016-01-06 NOTE — Patient Instructions (Signed)
Dapagliflozin tablets What is this medicine? DAPAGLIFLOZIN (DAP a gli FLOE zin) helps to treat type 2 diabetes. It helps to control blood sugar. Treatment is combined with diet and exercise. This medicine may be used for other purposes; ask your health care provider or pharmacist if you have questions. What should I tell my health care provider before I take this medicine? They need to know if you have any of these conditions: -bladder cancer -dehydration -diabetic ketoacidosis -diet low in salt -eating less due to illness, surgery, dieting, or any other reason -having surgery -high cholesterol -history of pancreatitis or pancreas problems -history of yeast infection of the penis or vagina -if you often drink alcohol -infections in the bladder, kidneys, or urinary tract -kidney disease -low blood pressure -on hemodialysis -problems urinating -type 1 diabetes -uncircumcised male -an unusual or allergic reaction to dapagliflozin, other medicines, foods, dyes, or preservatives -pregnant or trying to get pregnant -breast-feeding How should I use this medicine? Take this medicine by mouth with a glass of water. Follow the directions on the prescription label. You can take it with or without food. If it upsets your stomach, take it with food. Take this medicine in the morning. Take your dose at the same time each day. Do not take more often than directed. Do not stop taking except on your doctor's advice. A special MedGuide will be given to you by the pharmacist with each prescription and refill. Be sure to read this information carefully each time. Talk to your pediatrician regarding the use of this medicine in children. Special care may be needed. Overdosage: If you think you have taken too much of this medicine contact a poison control center or emergency room at once. NOTE: This medicine is only for you. Do not share this medicine with others. What if I miss a dose? If you miss a dose,  take it as soon as you can. If it is almost time for your next dose, take only that dose. Do not take double or extra doses. What may interact with this medicine? Do not take this medicine with any of the following medications: -gatifloxacinThis medicine may also interact with the following medications: -alcohol -certain medicines for blood pressure, heart disease -diuretics -insulin -nateglinide -pioglitazone -quinolone antibiotics like ciprofloxacin, levofloxacin, ofloxacin -repaglinide -some herbal dietary supplements -steroid medicines like prednisone or cortisone -sulfonylureas like glimepiride, glipizide, glyburide -thyroid medicine This list may not describe all possible interactions. Give your health care provider a list of all the medicines, herbs, non-prescription drugs, or dietary supplements you use. Also tell them if you smoke, drink alcohol, or use illegal drugs. Some items may interact with your medicine. What should I watch for while using this medicine? Visit your doctor or health care professional for regular checks on your progress. This medicine can cause a serious condition in which there is too much acid in the blood. If you develop nausea, vomiting, stomach pain, unusual tiredness, or breathing problems, stop taking this medicine and call your doctor right away. If possible, use a ketone dipstick to check for ketones in your urine. A test called the HbA1C (A1C) will be monitored. This is a simple blood test. It measures your blood sugar control over the last 2 to 3 months. You will receive this test every 3 to 6 months. Learn how to check your blood sugar. Learn the symptoms of low and high blood sugar and how to manage them. Always carry a quick-source of sugar with you in case you   have symptoms of low blood sugar. Examples include hard sugar candy or glucose tablets. Make sure others know that you can choke if you eat or drink when you develop serious symptoms of low  blood sugar, such as seizures or unconsciousness. They must get medical help at once. Tell your doctor or health care professional if you have high blood sugar. You might need to change the dose of your medicine. If you are sick or exercising more than usual, you might need to change the dose of your medicine. Do not skip meals. Ask your doctor or health care professional if you should avoid alcohol. Many nonprescription cough and cold products contain sugar or alcohol. These can affect blood sugar. Wear a medical ID bracelet or chain, and carry a card that describes your disease and details of your medicine and dosage times. What side effects may I notice from receiving this medicine? Side effects that you should report to your doctor or health care professional as soon as possible: -allergic reactions like skin rash, itching or hives, swelling of the face, lips, or tongue -breathing problems -dizziness -feeling faint or lightheaded, falls -muscle weakness -nausea, vomiting, unusual stomach upset or pain -signs and symptoms of low blood sugar such as feeling anxious, confusion, dizziness, increased hunger, unusually weak or tired, sweating, shakiness, cold, irritable, headache, blurred vision, fast heartbeat, loss of consciousness -signs and symptoms of a urinary tract infection, such as fever, chills, a burning feeling when urinating, blood in the urine, back pain -trouble passing urine or change in the amount of urine, including an urgent need to urinate more often, in larger amounts, or at night -penile discharge, itching, or pain in men -unusual tiredness -vaginal discharge, itching, or odor in women Side effects that usually do not require medical attention (Report these to your doctor or health care professional if they continue or are bothersome.): -constipation -mild increase in urination -stuffy or runny nose -sore throat -thirsty This list may not describe all possible side  effects. Call your doctor for medical advice about side effects. You may report side effects to FDA at 1-800-FDA-1088. Where should I keep my medicine? Keep out of the reach of children. Store at room temperature between 15 and 30 degrees C (59 and 86 degrees F). Throw away any unused medicine after the expiration date. NOTE: This sheet is a summary. It may not cover all possible information. If you have questions about this medicine, talk to your doctor, pharmacist, or health care provider.    2016, Elsevier/Gold Standard. (2014-02-12 11:14:42)  

## 2016-01-06 NOTE — Progress Notes (Signed)
Patient: David Baldwin Male    DOB: 04/26/1970   45 y.o.   MRN: [id number] Visit Date: 01/06/2016  Today's Provider: Mila Merryonald Maili Shutters, MD   Chief Complaint  Patient presents with  . Diabetes  . Hyperlipidemia  . Insomnia   Subjective:    HPI  Diabetes Mellitus Type II, Follow-up:   Lab Results  Component Value Date   HGBA1C 8.9 01/06/2016   HGBA1C 9.3 09/25/2015   HGBA1C 7.4 06/23/2015    Last seen for diabetes 3 months ago.  Management since then includes increasing Metformin from 1 tablet BID to 2 tablets BID. He reports good compliance with treatment. He is not having side effects.  Current symptoms include none and have been stable. Home blood sugar records: 147 after lunch today.  Episodes of hypoglycemia? no   Current Insulin Regimen: none Most Recent Eye Exam: pt unsure.  Weight trend: stable Prior visit with dietician: no Current diet: well balanced Current exercise: none  Pertinent Labs:    Component Value Date/Time   CHOL 155 09/25/2015 0856   CHOL 228 04/04/2014   TRIG 363 (H) 09/25/2015 0856   TRIG 550 04/04/2014   HDL 32 (L) 09/25/2015 0856   LDLCALC 50 09/25/2015 0856   CREATININE 0.72 (L) 09/25/2015 0856   CREATININE 0.81 04/04/2014    Wt Readings from Last 3 Encounters:  01/06/16 250 lb (113.4 kg)  09/25/15 254 lb (115.2 kg)  06/23/15 254 lb (115.2 kg)      Lipid/Cholesterol, Follow-up:   Last seen for this3 months ago.  Management changes since that visit include no changes. . Last Lipid Panel:    Component Value Date/Time   CHOL 155 09/25/2015 0856   CHOL 228 04/04/2014   TRIG 363 (H) 09/25/2015 0856   TRIG 550 04/04/2014   HDL 32 (L) 09/25/2015 0856   CHOLHDL 4.8 09/25/2015 0856   LDLCALC 50 09/25/2015 0856    Risk factors for vascular disease include diabetes mellitus  He reports good compliance with treatment. He is not having side effects.  Current symptoms include none and have been stable. Weight trend:  stable Prior visit with dietician: no Current diet: well balanced Current exercise: none  Wt Readings from Last 3 Encounters:  01/06/16 250 lb (113.4 kg)  09/25/15 254 lb (115.2 kg)  06/23/15 254 lb (115.2 kg)     Hypertension, follow-up:  BP Readings from Last 3 Encounters:  01/06/16 126/82  09/25/15 130/90  06/23/15 104/80    He was last seen for hypertension 3 months ago.  BP at that visit was 130/90. Management since that visit includes no changes. He reports good compliance with treatment. He is not having side effects.  He is not exercising. He is adherent to low salt diet.   Outside blood pressures are not being checked. Patient denies chest pain, chest pressure/discomfort, exertional chest pressure/discomfort, palpitations and tachypnea.   Cardiovascular risk factors include diabetes mellitus and dyslipidemia.     Weight trend: stable Wt Readings from Last 3 Encounters:  01/06/16 250 lb (113.4 kg)  09/25/15 254 lb (115.2 kg)  06/23/15 254 lb (115.2 kg)    Current diet: well balanced   Insomnia, follow up:   Patient comes in today for a follow up. He was last seen in the office about 3 months ago. Patient is requesting that Ativan be refilled. He reports that he usually sleeps well with the medication.   Allergies  Allergen Reactions  . Zolpidem Tartrate  Other (See Comments)     Current Outpatient Prescriptions:  .  glipiZIDE (GLUCOTROL) 10 MG tablet, Take 1 Tablet Before Breakfast And one before Supper as needed, Disp: 90 tablet, Rfl: 0 .  glucose blood test strip, 1 strip by Other route daily., Disp: , Rfl:  .  lisinopril-hydrochlorothiazide (PRINZIDE,ZESTORETIC) 20-25 MG tablet, Take 1 tablet by mouth daily., Disp: 30 tablet, Rfl: 6 .  LORazepam (ATIVAN) 1 MG tablet, Take 0.5-1 tablets (0.5-1 mg total) by mouth at bedtime as needed for sleep., Disp: 30 tablet, Rfl: 2 .  metFORMIN (GLUCOPHAGE-XR) 500 MG 24 hr tablet, Take 2 tablets (1,000 mg total) by  mouth 2 (two) times daily. Please place prescription on hold until patient calls for refill, Disp: 120 tablet, Rfl: 5 .  Naproxen Sodium 220 MG CAPS, Take 1 capsule by mouth daily as needed., Disp: , Rfl:  .  pravastatin (PRAVACHOL) 80 MG tablet, Take 1 tablet (80 mg total) by mouth daily., Disp: 90 tablet, Rfl: 3  Review of Systems  Constitutional: Negative.   Respiratory: Negative.   Cardiovascular: Negative.   Endocrine: Negative.   Musculoskeletal: Negative.   Neurological: Negative.   Psychiatric/Behavioral: Negative.     Social History  Substance Use Topics  . Smoking status: Never Smoker  . Smokeless tobacco: Not on file  . Alcohol use No   Objective:   BP 126/82 (BP Location: Left Arm, Patient Position: Sitting, Cuff Size: Large)   Pulse 84   Temp 99.3 F (37.4 C)   Resp 16   Ht 5\' 8"  (1.727 m)   Wt 250 lb (113.4 kg)   BMI 38.01 kg/m   Physical Exam  General appearance: alert, well developed, well nourished, cooperative and in no distress Head: Normocephalic, without obvious abnormality, atraumatic Respiratory: Respirations even and unlabored, normal respiratory rate Extremities: No gross deformities Skin: Skin color, texture, turgor normal. No rashes seen  Psych: Appropriate mood and affect. Neurologic: Mental status: Alert, oriented to person, place, and time, thought content appropriate.   Results for orders placed or performed in visit on 01/06/16  POCT glycosylated hemoglobin (Hb A1C)  Result Value Ref Range   Hemoglobin A1C 8.9    Est. average glucose Bld gHb Est-mCnc 209        Assessment & Plan:     1. Uncontrolled type 2 diabetes mellitus without complication, without long-term current use of insulin (HCC) Improved, but not to goal on maximum does metformin and glipizide Add samples of Farxiga 5mg  with rx to fill if tolerating.  - POCT glycosylated hemoglobin (Hb A1C)  2. Primary insomnia Refill lorazepam - LORazepam (ATIVAN) 1 MG tablet;  Take 0.5-1 tablets (0.5-1 mg total) by mouth at bedtime as needed for sleep.  Dispense: 30 tablet; Refill: 2  Return in about 6 weeks (around 02/17/2016).         Mila Merryonald Birtie Fellman, MD  [facility]

## 2016-01-14 ENCOUNTER — Ambulatory Visit
Admission: RE | Admit: 2016-01-14 | Discharge: 2016-01-14 | Disposition: A | Discharge status: Home / Self Care | Payer: BC Managed Care – PPO | Source: Ambulatory Visit | Attending: Family Medicine | Admitting: Family Medicine

## 2016-01-14 ENCOUNTER — Ambulatory Visit (INDEPENDENT_AMBULATORY_CARE_PROVIDER_SITE_OTHER): Payer: BC Managed Care – PPO | Admitting: Family Medicine

## 2016-01-14 ENCOUNTER — Encounter: Payer: Self-pay | Admitting: Family Medicine

## 2016-01-14 VITALS — BP 112/84 | HR 100 | Temp 97.8°F | Resp 16 | Wt 251.0 lb

## 2016-01-14 DIAGNOSIS — M47892 Other spondylosis, cervical region: Secondary | ICD-10-CM | POA: Diagnosis not present

## 2016-01-14 DIAGNOSIS — M542 Cervicalgia: Secondary | ICD-10-CM | POA: Insufficient documentation

## 2016-01-14 MED ORDER — CYCLOBENZAPRINE HCL 5 MG PO TABS: 5.0000 mg | ORAL_TABLET | Freq: Three times a day (TID) | ORAL | 0 refills | Status: DC | PRN

## 2016-01-14 MED ORDER — MELOXICAM 15 MG PO TABS: 15.0000 mg | ORAL_TABLET | Freq: Every day | ORAL | 0 refills | Status: DC

## 2016-01-14 NOTE — Patient Instructions (Signed)
We will call you with the x-ray report. Avoid ibuprofen and Aleve while on meloxicam.

## 2016-01-14 NOTE — Progress Notes (Signed)
Subjective:     Patient ID: David Baldwin, male   DOB: 02/03/1971, 45 y.o.   MRN: [id number]  HPI  Chief Complaint  Patient presents with  . Neck Pain    Pt has been experiencing neck for "months", but has since gradually worsened. Is now c/o no energy, N/V, headache. Denies chills/sweats. Temperature today is 97.8, though pt has taken ibuprofen this morning for neck pain. Ibuprofen provides no relief of pain. Is an 8/10 on a pain scale.  States he was working under his house before his neck flared up. 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 Reports occasional spasms.   Review of Systems     Objective:   Physical Exam  Constitutional: He appears well-developed and well-nourished. No distress.  Musculoskeletal:  Localizes pain over his posterior cervical vertebral area. Not tender. Cervical FROM with increased pain with flexion. Grip strength 5/5.       Assessment:    1. 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  Dispense: 30 tablet; Refill: 0 - cyclobenzaprine (FLEXERIL) 5 MG tablet; Take 1 tablet (5 mg total) by mouth 3 (three) times daily as needed for muscle spasms.  Dispense: 30 tablet; Refill: 0    Plan:    Further f/u pending x-ray results.

## 2016-01-15 ENCOUNTER — Telehealth: Payer: Self-pay

## 2016-01-15 NOTE — Telephone Encounter (Signed)
Pt advised. Verbally expresses understanding and agrees with the treatment plan. Allene DillonEmily Drozdowski, CMA

## 2016-01-15 NOTE — Telephone Encounter (Signed)
-----   Message from Anola Gurneyobert Chauvin, [place] sent at 01/15/2016  7:35 AM EST ----- Significant arthritic changes in your neck. If you are not improving over the next week or getting worse-call for orthopedic referral.

## 2016-01-19 ENCOUNTER — Other Ambulatory Visit: Payer: Self-pay | Admitting: Family Medicine

## 2016-01-19 DIAGNOSIS — I10 Essential (primary) hypertension: Secondary | ICD-10-CM

## 2016-01-19 MED ORDER — GLIPIZIDE 10 MG PO TABS: ORAL_TABLET | ORAL | 0 refills | Status: DC

## 2016-01-19 MED ORDER — LISINOPRIL-HYDROCHLOROTHIAZIDE 20-25 MG PO TABS: 1.0000 | ORAL_TABLET | Freq: Every day | ORAL | 6 refills | Status: DC

## 2016-01-19 NOTE — Telephone Encounter (Signed)
Pt needs refill on his   Lisinopril 20-25 mg Glipizide 10mg   [facility] [address]  Thanks [place]

## 2016-02-17 ENCOUNTER — Ambulatory Visit: Payer: BC Managed Care – PPO | Admitting: Family Medicine

## 2016-02-24 ENCOUNTER — Other Ambulatory Visit: Payer: Self-pay | Admitting: Family Medicine

## 2016-02-25 ENCOUNTER — Telehealth: Payer: Self-pay

## 2016-02-25 NOTE — Telephone Encounter (Signed)
OK to change quantity to 180 for 90 day supply.

## 2016-02-25 NOTE — Telephone Encounter (Signed)
Toni AmendCourtney who [facility] the [facility] needing verification for the qty of Glipizide. She states the qty ordered was for 90 tablets. She wants to know since patient is taking 2 pills a day if you wanted this to be a 90 day supply or just 90 tablets? Call back at ( [phone].

## 2016-02-25 NOTE — Telephone Encounter (Signed)
Advised pharmacy as below.  

## 2016-03-12 ENCOUNTER — Encounter: Payer: Self-pay | Admitting: Family Medicine

## 2016-03-12 ENCOUNTER — Ambulatory Visit (INDEPENDENT_AMBULATORY_CARE_PROVIDER_SITE_OTHER): Payer: BC Managed Care – PPO | Admitting: Family Medicine

## 2016-03-12 VITALS — BP 120/68 | Temp 98.6°F | Resp 16 | Ht 68.0 in | Wt 244.0 lb

## 2016-03-12 DIAGNOSIS — M542 Cervicalgia: Secondary | ICD-10-CM | POA: Diagnosis not present

## 2016-03-12 DIAGNOSIS — E119 Type 2 diabetes mellitus without complications: Secondary | ICD-10-CM

## 2016-03-12 LAB — POCT GLYCOSYLATED HEMOGLOBIN (HGB A1C)
ESTIMATED AVERAGE GLUCOSE: 163
HEMOGLOBIN A1C: 7.3

## 2016-03-12 NOTE — Progress Notes (Signed)
Patient: David Baldwin Male    DOB: 05/26/1970   46 y.o.   MRN: [id number] Visit Date: 03/12/2016  Today's Provider: Mila Merry, MD   Chief Complaint  Patient presents with  . Diabetes    6 week follow up    Subjective:    HPI    Diabetes Mellitus Type II, Follow-up:   Lab Results  Component Value Date   HGBA1C 7.3 03/12/2016   HGBA1C 8.9 01/06/2016   HGBA1C 9.3 09/25/2015    Last seen for diabetes 3 months ago.  Management since then includes adding Farxiga 5mg  daily. He reports good compliance with treatment. He is having side effects.  Current symptoms include none and have been stable. Home blood sugar records: not being checked.   Episodes of hypoglycemia? no   Weight trend: stable Prior visit with dietician: none Current diet: well balanced Current exercise: none  Pertinent Labs:    Component Value Date/Time   CHOL 155 09/25/2015 0856   CHOL 228 04/04/2014   TRIG 363 (H) 09/25/2015 0856   TRIG 550 04/04/2014   HDL 32 (L) 09/25/2015 0856   LDLCALC 50 09/25/2015 0856   CREATININE 0.72 (L) 09/25/2015 0856   CREATININE 0.81 04/04/2014    Wt Readings from Last 3 Encounters:  03/12/16 244 lb (110.7 kg)  01/14/16 251 lb (113.9 kg)  01/06/16 250 lb (113.4 kg)          Allergies  Allergen Reactions  . Zolpidem Tartrate Other (See Comments)     Current Outpatient Prescriptions:  .  cyclobenzaprine (FLEXERIL) 5 MG tablet, Take 1 tablet (5 mg total) by mouth 3 (three) times daily as needed for muscle spasms., Disp: 30 tablet, Rfl: 0 .  dapagliflozin propanediol (FARXIGA) 5 MG TABS tablet, Take 5 mg by mouth every morning., Disp: 30 tablet, Rfl: 1 .  glipiZIDE (GLUCOTROL) 10 MG tablet, TAKE ONE TABLET BY MOUTH BEFORE BREAKFAST AND ONE BEFORE SUPPER AS NEEDED, Disp: 90 tablet, Rfl: 4 .  glucose blood test strip, 1 strip by Other route daily., Disp: , Rfl:  .  lisinopril-hydrochlorothiazide (PRINZIDE,ZESTORETIC) 20-25 MG tablet, Take 1 tablet  by mouth daily., Disp: 30 tablet, Rfl: 6 .  LORazepam (ATIVAN) 1 MG tablet, Take 0.5-1 tablets (0.5-1 mg total) by mouth at bedtime as needed for sleep., Disp: 30 tablet, Rfl: 2 .  meloxicam (MOBIC) 15 MG tablet, Take 1 tablet (15 mg total) by mouth daily., Disp: 30 tablet, Rfl: 0 .  metFORMIN (GLUCOPHAGE-XR) 500 MG 24 hr tablet, Take 2 tablets (1,000 mg total) by mouth 2 (two) times daily. Please place prescription on hold until patient calls for refill, Disp: 120 tablet, Rfl: 5 .  Naproxen Sodium 220 MG CAPS, Take 1 capsule by mouth daily as needed., Disp: , Rfl:  .  pravastatin (PRAVACHOL) 80 MG tablet, Take 1 tablet (80 mg total) by mouth daily. (Patient not taking: Reported on 03/12/2016), Disp: 90 tablet, Rfl: 3  Review of Systems  Constitutional: Negative for activity change, appetite change, chills, diaphoresis, fatigue, fever and unexpected weight change.  Cardiovascular: Negative.   Endocrine: Negative for cold intolerance, heat intolerance, polydipsia, polyphagia and polyuria.  Neurological: Negative for dizziness, tremors, seizures, syncope, facial asymmetry, speech difficulty, weakness, light-headedness, numbness and headaches.    Social History  Substance Use Topics  . Smoking status: Never Smoker  . Smokeless tobacco: Never Used  . Alcohol use No   Objective:   BP 120/68 (BP Location: Right Arm, Patient  Position: Sitting, Cuff Size: Normal)   Temp 98.6 F (37 C)   Resp 16   Ht 5\' 8"  (1.727 m)   Wt 244 lb (110.7 kg)   BMI 37.10 kg/m   Physical Exam  General appearance: alert, well developed, well nourished, cooperative and in no distress Head: Normocephalic, without obvious abnormality, atraumatic Respiratory: Respirations even and unlabored, normal respiratory rate Extremities: No gross deformities Skin: Skin color, texture, turgor normal. No rashes seen  Psych: Appropriate mood and affect. Neurologic: Mental status: Alert, oriented to person, place, and time,  thought content appropriate.   Results for orders placed or performed in visit on 03/12/16  POCT glycosylated hemoglobin (Hb A1C)  Result Value Ref Range   Hemoglobin A1C 7.3    Est. average glucose Bld gHb Est-mCnc 163        Assessment & Plan:     1. Controlled type 2 diabetes mellitus without complication, without long-term current use of insulin (HCC) Doing well and improved since starting [place]. If electrolytes are normal will increase to 10 and reduced glipizide to 5 - POCT glycosylated hemoglobin (Hb A1C) - Renal function panel  2. Acute neck pain Better today. He states a friend told about an prescription analgesic cream called Valspar or somehitng similar that he would like to try. Will try to get more specific information.        Mila Merryonald Fisher, MD  [facility]

## 2016-03-16 ENCOUNTER — Telehealth: Payer: Self-pay | Admitting: Family Medicine

## 2016-03-16 DIAGNOSIS — I10 Essential (primary) hypertension: Secondary | ICD-10-CM

## 2016-03-16 DIAGNOSIS — IMO0001 Reserved for inherently not codable concepts without codable children: Secondary | ICD-10-CM

## 2016-03-16 DIAGNOSIS — E1165 Type 2 diabetes mellitus with hyperglycemia: Principal | ICD-10-CM

## 2016-03-16 LAB — RENAL FUNCTION PANEL
ALBUMIN: 4.7 g/dL (ref 3.5–5.5)
BUN / CREAT RATIO: 12 (ref 9–20)
BUN: 10 mg/dL (ref 6–24)
CO2: 22 mmol/L (ref 18–29)
Calcium: 10 mg/dL (ref 8.7–10.2)
Chloride: 95 mmol/L — ABNORMAL LOW (ref 96–106)
Creatinine, Ser: 0.81 mg/dL (ref 0.76–1.27)
GFR calc Af Amer: 122 mL/min/{1.73_m2} (ref >59)
GFR, EST NON AFRICAN AMERICAN: 106 mL/min/{1.73_m2} (ref >59)
GLUCOSE: 102 mg/dL — AB (ref 65–99)
Phosphorus: 4.4 mg/dL (ref 2.5–4.5)
Potassium: 3.4 mmol/L — ABNORMAL LOW (ref 3.5–5.2)
Sodium: 136 mmol/L (ref 134–144)

## 2016-03-16 MED ORDER — LISINOPRIL-HYDROCHLOROTHIAZIDE 20-12.5 MG PO TABS: 1.0000 | ORAL_TABLET | Freq: Every day | ORAL | 3 refills | Status: DC

## 2016-03-16 MED ORDER — GLIPIZIDE 5 MG PO TABS: 5.0000 mg | ORAL_TABLET | Freq: Two times a day (BID) | ORAL | 3 refills | Status: DC

## 2016-03-16 MED ORDER — DAPAGLIFLOZIN PROPANEDIOL 10 MG PO TABS: 10.0000 mg | ORAL_TABLET | Freq: Every day | ORAL | 3 refills | Status: DC

## 2016-03-16 NOTE — Telephone Encounter (Signed)
-----   Message from Malva Limesonald E Fisher, MD sent at 03/16/2016  7:53 AM EST ----- Kidney functions are good. Potassium is a little low. Need to  1. Increase Farxiga to 10mg  daily, #30, rf x 3 2. Reduce glipizide to 5mg  twice a day before meals, #60, rf x 3 3. Reduce lisinopril-hctz to 20-12.5 daily, #30, rf x 3   (due to low potassium) Get a little more potassium rich foods in diet such as sweet potatoes bananas and dark greek leafy vegetables.   Schedule follow up for diabetes in 2 months.

## 2016-03-16 NOTE — Telephone Encounter (Signed)
Advised patient of results. Sent in new medications into the pharmacy. Discontinued the old ones. Appt was scheduled.

## 2016-04-29 ENCOUNTER — Other Ambulatory Visit: Payer: Self-pay | Admitting: Family Medicine

## 2016-04-29 DIAGNOSIS — F5101 Primary insomnia: Secondary | ICD-10-CM

## 2016-04-29 MED ORDER — LORAZEPAM 1 MG PO TABS: 0.5000 mg | ORAL_TABLET | Freq: Every evening | ORAL | 2 refills | Status: DC | PRN

## 2016-04-29 NOTE — Telephone Encounter (Signed)
rx called in-aa 

## 2016-04-29 NOTE — Telephone Encounter (Signed)
Patient is requsting refills on his metFORMIN (GLUCOPHAGE-XR) 500 MG 24 hr tablet and  LORazepam (ATIVAN) 1 MG tablet   [facility] [address]

## 2016-04-29 NOTE — Telephone Encounter (Signed)
Please call in lorazepam.  

## 2016-04-30 ENCOUNTER — Other Ambulatory Visit: Payer: Self-pay | Admitting: Family Medicine

## 2016-04-30 DIAGNOSIS — IMO0001 Reserved for inherently not codable concepts without codable children: Secondary | ICD-10-CM

## 2016-04-30 DIAGNOSIS — E1165 Type 2 diabetes mellitus with hyperglycemia: Principal | ICD-10-CM

## 2016-05-25 ENCOUNTER — Other Ambulatory Visit: Payer: Self-pay | Admitting: Family Medicine

## 2016-05-25 DIAGNOSIS — E782 Mixed hyperlipidemia: Secondary | ICD-10-CM

## 2016-05-25 MED ORDER — PRAVASTATIN SODIUM 80 MG PO TABS: 80.0000 mg | ORAL_TABLET | Freq: Every day | ORAL | 4 refills | Status: DC

## 2016-05-25 NOTE — Telephone Encounter (Signed)
Pt contacted office for refill request on the following medications:  pravastatin (PRAVACHOL) 80 MG tablet.  [facility] [address]  CB#[phone]/MW

## 2016-05-31 ENCOUNTER — Encounter: Payer: Self-pay | Admitting: Family Medicine

## 2016-05-31 ENCOUNTER — Ambulatory Visit (INDEPENDENT_AMBULATORY_CARE_PROVIDER_SITE_OTHER): Payer: BC Managed Care – PPO | Admitting: Family Medicine

## 2016-05-31 VITALS — BP 110/76 | HR 86 | Temp 99.1°F | Resp 16 | Wt 244.0 lb

## 2016-05-31 DIAGNOSIS — I10 Essential (primary) hypertension: Secondary | ICD-10-CM

## 2016-05-31 DIAGNOSIS — E1165 Type 2 diabetes mellitus with hyperglycemia: Secondary | ICD-10-CM

## 2016-05-31 DIAGNOSIS — IMO0001 Reserved for inherently not codable concepts without codable children: Secondary | ICD-10-CM

## 2016-05-31 DIAGNOSIS — E876 Hypokalemia: Secondary | ICD-10-CM

## 2016-05-31 NOTE — Patient Instructions (Signed)
   Please contact your eyecare professional to schedule a routine eye exam  

## 2016-05-31 NOTE — Progress Notes (Signed)
Patient: David Baldwin Male    DOB: 1970-03-18   46 y.o.   MRN: [id number] Visit Date: 05/31/2016  Today's Provider: Mila Merry, MD   Chief Complaint  Patient presents with  . Follow-up   Subjective:    HPI Follow up of Hypokalemia:  Patient was last seen for this problem on 03/12/2016. Management during that visit includes ordering labs and which showed potassium levels were low. Patient was advised to reduce lisinopril-hctz to 20-12.5 daily, and to get a little more potassium rich foods in diet such as sweet potatoes bananas and dark greek leafy vegetables. Patient comes in today reporting good compliance with treatment.   BMET    Component Value Date/Time   NA 136 03/15/2016 1555   NA 140 04/04/2014   K 3.4 (L) 03/15/2016 1555   K 4.4 04/04/2014   CL 95 (L) 03/15/2016 1555   CL 104 06/28/2011 1922   CO2 22 03/15/2016 1555   CO2 25 06/28/2011 1922   GLUCOSE 102 (H) 03/15/2016 1555   GLUCOSE 141 (H) 06/28/2011 1922   BUN 10 03/15/2016 1555   BUN 8 04/04/2014   CREATININE 0.81 03/15/2016 1555   CREATININE 0.81 04/04/2014   CALCIUM 10.0 03/15/2016 1555   CALCIUM 9.6 06/28/2011 1922   GFRNONAA 106 03/15/2016 1555   GFRAA 122 03/15/2016 1555      Diabetes Mellitus Type II, Follow-up:   Lab Results  Component Value Date   HGBA1C 7.3 03/12/2016   HGBA1C 8.9 01/06/2016   HGBA1C 9.3 09/25/2015    Last seen for diabetes 2 months ago.   Management during that visit includes increasing Farxiga to 10mg  daily and reducing glipizide to 5mg  twice a day before meals . He reports good compliance with treatment. He is not having side effects.  Current symptoms include none and have been stable. Home blood sugar records: blood sugars are not being checked  Episodes of hypoglycemia? no   Current Insulin Regimen: none Most Recent Eye Exam: 1 year ago Weight trend: stable Prior visit with dietician: no Current diet: well balanced Current exercise:  walking  Pertinent Labs:    Component Value Date/Time   CHOL 155 09/25/2015 0856   CHOL 228 04/04/2014   TRIG 363 (H) 09/25/2015 0856   TRIG 550 04/04/2014   HDL 32 (L) 09/25/2015 0856   LDLCALC 50 09/25/2015 0856   CREATININE 0.81 03/15/2016 1555   CREATININE 0.81 04/04/2014    Wt Readings from Last 3 Encounters:  03/12/16 244 lb (110.7 kg)  01/14/16 251 lb (113.9 kg)  01/06/16 250 lb (113.4 kg)    ------------------------------------------------------------------------     Allergies  Allergen Reactions  . Zolpidem Tartrate Other (See Comments)     Current Outpatient Prescriptions:  .  cyclobenzaprine (FLEXERIL) 5 MG tablet, Take 1 tablet (5 mg total) by mouth 3 (three) times daily as needed for muscle spasms., Disp: 30 tablet, Rfl: 0 .  dapagliflozin propanediol (FARXIGA) 10 MG TABS tablet, Take 10 mg by mouth daily., Disp: 30 tablet, Rfl: 3 .  glipiZIDE (GLUCOTROL) 5 MG tablet, Take 1 tablet (5 mg total) by mouth 2 (two) times daily before a meal., Disp: 60 tablet, Rfl: 3 .  glucose blood test strip, 1 strip by Other route daily., Disp: , Rfl:  .  lisinopril-hydrochlorothiazide (ZESTORETIC) 20-12.5 MG tablet, Take 1 tablet by mouth daily., Disp: 30 tablet, Rfl: 3 .  LORazepam (ATIVAN) 1 MG tablet, Take 0.5-1 tablets (0.5-1 mg total) by mouth  at bedtime as needed for sleep., Disp: 30 tablet, Rfl: 2 .  meloxicam (MOBIC) 15 MG tablet, Take 1 tablet (15 mg total) by mouth daily., Disp: 30 tablet, Rfl: 0 .  metFORMIN (GLUCOPHAGE-XR) 500 MG 24 hr tablet, TAKE TWO TABLETS BY MOUTH TWICE DAILY, Disp: 120 tablet, Rfl: 12 .  Naproxen Sodium 220 MG CAPS, Take 1 capsule by mouth daily as needed., Disp: , Rfl:  .  pravastatin (PRAVACHOL) 80 MG tablet, Take 1 tablet (80 mg total) by mouth daily., Disp: 90 tablet, Rfl: 4  Review of Systems  Constitutional: Negative for appetite change, chills and fever.  Respiratory: Negative for chest tightness, shortness of breath and wheezing.    Cardiovascular: Negative for chest pain and palpitations.  Gastrointestinal: Negative for abdominal pain, nausea and vomiting.  Endocrine: Negative for cold intolerance, heat intolerance, polydipsia, polyphagia and polyuria.    Social History  Substance Use Topics  . Smoking status: Never Smoker  . Smokeless tobacco: Never Used  . Alcohol use No   Objective:   BP 110/76 (BP Location: Right Arm)   Pulse 86   Temp 99.1 F (37.3 C) (Oral)   Resp 16   Wt 244 lb (110.7 kg)   SpO2 96% Comment: room air  BMI 37.10 kg/m     Physical Exam   General Appearance:    Alert, cooperative, no distress  Eyes:    PERRL, conjunctiva/corneas clear, EOM's intact       Lungs:     Clear to auscultation bilaterally, respirations unlabored  Heart:    Regular rate and rhythm  Neurologic:   Awake, alert, oriented x 3. No apparent focal neurological           defect.           Assessment & Plan:     1. Uncontrolled type 2 diabetes mellitus without complication, without long-term current use of insulin (HCC) Doing well with increased dose of Farxiga.  - Hemoglobin A1c - Renal function panel  2. Essential hypertension Well controlled since reducing hctz. Consider reducing BP medication further after reviewing lab results.   3. Hypokalemia Likely secondary to [place] and hctz and expect improvement today.        Mila Merryonald Christabelle Hanzlik, MD  [facility]

## 2016-06-01 ENCOUNTER — Other Ambulatory Visit: Payer: Self-pay

## 2016-06-01 LAB — RENAL FUNCTION PANEL
Albumin: 5.2 g/dL (ref 3.5–5.5)
BUN/Creatinine Ratio: 11 (ref 9–20)
BUN: 11 mg/dL (ref 6–24)
CALCIUM: 10.6 mg/dL — AB (ref 8.7–10.2)
CHLORIDE: 104 mmol/L (ref 96–106)
CO2: 20 mmol/L (ref 18–29)
Creatinine, Ser: 0.97 mg/dL (ref 0.76–1.27)
GFR calc Af Amer: 107 mL/min/{1.73_m2} (ref >59)
GFR calc non Af Amer: 93 mL/min/{1.73_m2} (ref >59)
GLUCOSE: 156 mg/dL — AB (ref 65–99)
PHOSPHORUS: 4.9 mg/dL — AB (ref 2.5–4.5)
POTASSIUM: 4.5 mmol/L (ref 3.5–5.2)
Sodium: 151 mmol/L — ABNORMAL HIGH (ref 134–144)

## 2016-06-01 LAB — HEMOGLOBIN A1C
ESTIMATED AVERAGE GLUCOSE: 157 mg/dL
Hgb A1c MFr Bld: 7.1 % — ABNORMAL HIGH (ref 4.8–5.6)

## 2016-06-01 MED ORDER — GLIPIZIDE 5 MG PO TABS: 5.0000 mg | ORAL_TABLET | Freq: Every day | ORAL | 3 refills | Status: DC

## 2016-06-01 MED ORDER — LISINOPRIL-HYDROCHLOROTHIAZIDE 10-12.5 MG PO TABS: 1.0000 | ORAL_TABLET | Freq: Every day | ORAL | 3 refills | Status: DC

## 2016-06-01 NOTE — Progress Notes (Signed)
Done  ED 

## 2016-06-28 LAB — HM DIABETES EYE EXAM

## 2016-07-12 ENCOUNTER — Other Ambulatory Visit: Payer: Self-pay | Admitting: Family Medicine

## 2016-07-12 MED ORDER — DAPAGLIFLOZIN PROPANEDIOL 10 MG PO TABS: 10.0000 mg | ORAL_TABLET | Freq: Every day | ORAL | 5 refills | Status: DC

## 2016-07-12 NOTE — Telephone Encounter (Signed)
Pt contacted office for refill request on the following medications:  dapagliflozin propanediol (FARXIGA) 10 MG TABS tablet.  [facility] [address]  CB#[phone]/MW

## 2016-07-21 ENCOUNTER — Encounter: Payer: Self-pay | Admitting: *Deleted

## 2016-07-29 ENCOUNTER — Other Ambulatory Visit: Payer: Self-pay

## 2016-07-29 DIAGNOSIS — F5101 Primary insomnia: Secondary | ICD-10-CM

## 2016-07-29 MED ORDER — LORAZEPAM 1 MG PO TABS: 0.5000 mg | ORAL_TABLET | Freq: Every evening | ORAL | 2 refills | Status: DC | PRN

## 2016-07-29 NOTE — Telephone Encounter (Signed)
Patient request refill

## 2016-07-29 NOTE — Telephone Encounter (Signed)
Patient requesting 90 day supply if possible

## 2016-07-29 NOTE — Telephone Encounter (Signed)
Please call in lorazepam.  

## 2016-07-30 ENCOUNTER — Other Ambulatory Visit: Payer: Self-pay | Admitting: Family Medicine

## 2016-07-30 DIAGNOSIS — F5101 Primary insomnia: Secondary | ICD-10-CM

## 2016-07-30 NOTE — Telephone Encounter (Signed)
Please call in lorazepam.  

## 2016-07-30 NOTE — Telephone Encounter (Signed)
rx called in-aa 

## 2016-08-27 ENCOUNTER — Other Ambulatory Visit: Payer: Self-pay | Admitting: Family Medicine

## 2016-08-27 MED ORDER — GLIPIZIDE 5 MG PO TABS: 5.0000 mg | ORAL_TABLET | Freq: Every day | ORAL | 2 refills | Status: DC

## 2016-08-27 NOTE — Telephone Encounter (Signed)
Pt needs refill on   glipiZIDE (GLUCOTROL) 5 MG tablet  [facility] [address]  Thanks Barth Kirkseri

## 2016-09-01 ENCOUNTER — Ambulatory Visit: Payer: BC Managed Care – PPO | Admitting: Family Medicine

## 2016-09-07 ENCOUNTER — Encounter: Payer: Self-pay | Admitting: Family Medicine

## 2016-09-07 ENCOUNTER — Ambulatory Visit (INDEPENDENT_AMBULATORY_CARE_PROVIDER_SITE_OTHER): Payer: BC Managed Care – PPO | Admitting: Family Medicine

## 2016-09-07 VITALS — BP 128/82 | HR 72 | Temp 98.2°F | Resp 16 | Ht 68.0 in | Wt 239.0 lb

## 2016-09-07 DIAGNOSIS — E782 Mixed hyperlipidemia: Secondary | ICD-10-CM

## 2016-09-07 DIAGNOSIS — E119 Type 2 diabetes mellitus without complications: Secondary | ICD-10-CM | POA: Diagnosis not present

## 2016-09-07 DIAGNOSIS — I10 Essential (primary) hypertension: Secondary | ICD-10-CM

## 2016-09-07 LAB — POCT GLYCOSYLATED HEMOGLOBIN (HGB A1C)
ESTIMATED AVERAGE GLUCOSE: 154
Hemoglobin A1C: 7

## 2016-09-07 NOTE — Progress Notes (Signed)
Patient: David Baldwin Male    DOB: 12/16/1970   46 y.o.   MRN: [id number] Visit Date: 09/07/2016  Today's Provider: Mila Merryonald Fisher, MD   Chief Complaint  Patient presents with  . Follow-up  . Diabetes  . Hyperlipidemia  . Hypertension   Subjective:    HPI   Diabetes Mellitus Type II, Follow-up:   Lab Results  Component Value Date   HGBA1C 7.0 09/07/2016   HGBA1C 7.1 (H) 05/31/2016   HGBA1C 7.3 03/12/2016   Last seen for diabetes 4 months ago.  Management since then includes; reduce glipizide to 5 mg qd He reports good compliance with treatment. He is not having side effects.  Current symptoms include none and have been stable. Home blood sugar records: trend: stable  Episodes of hypoglycemia? no   Current Insulin Regimen: none Most Recent Eye Exam: 05/2016.  Weight trend: stable Prior visit with dietician: no Current diet: well balanced Current exercise: no regular exercise     Hypertension, follow-up:  BP Readings from Last 3 Encounters:  09/07/16 128/82  05/31/16 110/76  03/12/16 120/68    He was last seen for hypertension 4 months ago.  BP at that visit was 110/76. Management since that visit includes; reduce lisinopril/HCTZ to 10-12.5.He reports good compliance with treatment. He is not having side effects.  He is not exercising. He is adherent to low salt diet.   Outside blood pressures are checked occasionally. He is experiencing none.  Patient denies exertional chest pressure/discomfort, lower extremity edema and palpitations.   Cardiovascular risk factors include diabetes mellitus and dyslipidemia.       Lipid/Cholesterol, Follow-up:   Last seen for this 4 months ago.  Management since that visit includes; .  Last Lipid Panel:    Component Value Date/Time   CHOL 155 09/25/2015 0856   CHOL 228 04/04/2014   TRIG 363 (H) 09/25/2015 0856   TRIG 550 04/04/2014   HDL 32 (L) 09/25/2015 0856   CHOLHDL 4.8 09/25/2015 0856   LDLCALC 50 09/25/2015 0856    He reports good compliance with treatment. He is not having side effects.   Wt Readings from Last 3 Encounters:  09/07/16 239 lb (108.4 kg)  05/31/16 244 lb (110.7 kg)  03/12/16 244 lb (110.7 kg)        Allergies  Allergen Reactions  . Zolpidem Tartrate Other (See Comments)     Current Outpatient Prescriptions:  .  cyclobenzaprine (FLEXERIL) 5 MG tablet, Take 1 tablet (5 mg total) by mouth 3 (three) times daily as needed for muscle spasms., Disp: 30 tablet, Rfl: 0 .  dapagliflozin propanediol (FARXIGA) 10 MG TABS tablet, Take 10 mg by mouth daily., Disp: 30 tablet, Rfl: 5 .  glipiZIDE (GLUCOTROL) 5 MG tablet, Take 1 tablet (5 mg total) by mouth daily before breakfast., Disp: 90 tablet, Rfl: 2 .  glucose blood test strip, 1 strip by Other route daily., Disp: , Rfl:  .  lisinopril-hydrochlorothiazide (PRINZIDE,ZESTORETIC) 10-12.5 MG tablet, Take 1 tablet by mouth daily., Disp: 90 tablet, Rfl: 3 .  LORazepam (ATIVAN) 1 MG tablet, TAKE 1/2 TO 1 (ONE-HALF TO ONE) TABLET BY MOUTH AT BEDTIME AS NEEDED, Disp: 30 tablet, Rfl: 2 .  meloxicam (MOBIC) 15 MG tablet, Take 1 tablet (15 mg total) by mouth daily., Disp: 30 tablet, Rfl: 0 .  metFORMIN (GLUCOPHAGE-XR) 500 MG 24 hr tablet, TAKE TWO TABLETS BY MOUTH TWICE DAILY, Disp: 120 tablet, Rfl: 12 .  Naproxen Sodium 220 MG  CAPS, Take 1 capsule by mouth daily as needed., Disp: , Rfl:  .  pravastatin (PRAVACHOL) 80 MG tablet, Take 1 tablet (80 mg total) by mouth daily., Disp: 90 tablet, Rfl: 4  Review of Systems  Constitutional: Negative for appetite change, chills and fever.  Respiratory: Negative for chest tightness, shortness of breath and wheezing.   Cardiovascular: Negative for chest pain and palpitations.  Gastrointestinal: Negative for abdominal pain, nausea and vomiting.    Social History  Substance Use Topics  . Smoking status: Never Smoker  . Smokeless tobacco: Never Used  . Alcohol use No    Objective:   BP 128/82 (BP Location: Right Arm, Patient Position: Sitting, Cuff Size: Large)   Pulse 72   Temp 98.2 F (36.8 C)   Resp 16   Ht 5\' 8"  (1.727 m)   Wt 239 lb (108.4 kg)   BMI 36.34 kg/m  Vitals:   09/07/16 1641  BP: 128/82  Pulse: 72  Resp: 16  Temp: 98.2 F (36.8 C)  Weight: 239 lb (108.4 kg)  Height: 5\' 8"  (1.727 m)     Physical Exam  General Appearance:    Alert, cooperative, no distress, obese  Eyes:    PERRL, conjunctiva/corneas clear, EOM's intact       Lungs:     Clear to auscultation bilaterally, respirations unlabored  Heart:    Regular rate and rhythm  Neurologic:   Awake, alert, oriented x 3. No apparent focal neurological           defect.        Results for orders placed or performed in visit on 09/07/16  POCT glycosylated hemoglobin (Hb A1C)  Result Value Ref Range   Hemoglobin A1C 7.0    Est. average glucose Bld gHb Est-mCnc 154        Assessment & Plan:     1. Type 2 diabetes mellitus without complication, without long-term current use of insulin (HCC) Well controlled.  Continue current medications.   - POCT glycosylated hemoglobin (Hb A1C)  2. Essential hypertension Well controlled.  Continue current medications.   - EKG 12-Lead  3. Hyperlipemia, mixed He is tolerating pravastatin well with no adverse effects.   - Lipid panel - Comprehensive metabolic panel  4. Morbid obesity (HCC) Printed information regarding diet and exercise.        Mila Merry, MD  [facility]

## 2016-09-07 NOTE — Patient Instructions (Signed)
Diabetes Mellitus and Exercise Exercising regularly is important for your overall health, especially when you have diabetes (diabetes mellitus). Exercising is not only about losing weight. It has many health benefits, such as increasing muscle strength and bone density and reducing body fat and stress. This leads to improved fitness, flexibility, and endurance, all of which result in better overall health. Exercise has additional benefits for people with diabetes, including:  Reducing appetite.  Helping to lower and control blood glucose.  Lowering blood pressure.  Helping to control amounts of fatty substances (lipids) in the blood, such as cholesterol and triglycerides.  Helping the body to respond better to insulin (improving insulin sensitivity).  Reducing how much insulin the body needs.  Decreasing the risk for heart disease by: ? Lowering cholesterol and triglyceride levels. ? Increasing the levels of good cholesterol. ? Lowering blood glucose levels.  What is my activity plan? Your health care provider or certified diabetes educator can help you make a plan for the type and frequency of exercise (activity plan) that works for you. Make sure that you:  Do at least 150 minutes of moderate-intensity or vigorous-intensity exercise each week. This could be brisk walking, biking, or water aerobics. ? Do stretching and strength exercises, such as yoga or weightlifting, at least 2 times a week. ? Spread out your activity over at least 3 days of the week.  Get some form of physical activity every day. ? Do not go more than 2 days in a row without some kind of physical activity. ? Avoid being inactive for more than 90 minutes at a time. Take frequent breaks to walk or stretch.  Choose a type of exercise or activity that you enjoy, and set realistic goals.  Start slowly, and gradually increase the intensity of your exercise over time.  What do I need to know about managing my  diabetes?  Check your blood glucose before and after exercising. ? If your blood glucose is higher than 240 mg/dL (13.3 mmol/L) before you exercise, check your urine for ketones. If you have ketones in your urine, do not exercise until your blood glucose returns to normal.  Know the symptoms of low blood glucose (hypoglycemia) and how to treat it. Your risk for hypoglycemia increases during and after exercise. Common symptoms of hypoglycemia can include: ? Hunger. ? Anxiety. ? Sweating and feeling clammy. ? Confusion. ? Dizziness or feeling light-headed. ? Increased heart rate or palpitations. ? Blurry vision. ? Tingling or numbness around the mouth, lips, or tongue. ? Tremors or shakes. ? Irritability.  Keep a rapid-acting carbohydrate snack available before, during, and after exercise to help prevent or treat hypoglycemia.  Avoid injecting insulin into areas of the body that are going to be exercised. For example, avoid injecting insulin into: ? The arms, when playing tennis. ? The legs, when jogging.  Keep records of your exercise habits. Doing this can help you and your health care provider adjust your diabetes management plan as needed. Write down: ? Food that you eat before and after you exercise. ? Blood glucose levels before and after you exercise. ? The type and amount of exercise you have done. ? When your insulin is expected to peak, if you use insulin. Avoid exercising at times when your insulin is peaking.  When you start a new exercise or activity, work with your health care provider to make sure the activity is safe for you, and to adjust your insulin, medicines, or food intake as needed.    Drink plenty of water while you exercise to prevent dehydration or heat stroke. Drink enough fluid to keep your urine clear or pale yellow. This information is not intended to replace advice given to you by your health care provider. Make sure you discuss any questions you have with  your health care provider. Document Released: 05/15/2003 Document Revised: 09/12/2015 Document Reviewed: 08/04/2015 Elsevier Interactive Patient Education  2018 [employer].          Diabetes Mellitus and Food It is important for you to manage your blood sugar (glucose) level. Your blood glucose level can be greatly affected by what you eat. Eating healthier foods in the appropriate amounts throughout the day at about the same time each day will help you control your blood glucose level. It can also help slow or prevent worsening of your diabetes mellitus. Healthy eating may even help you improve the level of your blood pressure and reach or maintain a healthy weight. General recommendations for healthful eating and cooking habits include:  Eating meals and snacks regularly. Avoid going long periods of time without eating to lose weight.  Eating a diet that consists mainly of plant-based foods, such as fruits, vegetables, nuts, legumes, and whole grains.  Using low-heat cooking methods, such as baking, instead of high-heat cooking methods, such as deep frying.  Work with your dietitian to make sure you understand how to use the Nutrition Facts information on food labels. How can food affect me? Carbohydrates Carbohydrates affect your blood glucose level more than any other type of food. Your dietitian will help you determine how many carbohydrates to eat at each meal and teach you how to count carbohydrates. Counting carbohydrates is important to keep your blood glucose at a healthy level, especially if you are using insulin or taking certain medicines for diabetes mellitus. Alcohol Alcohol can cause sudden decreases in blood glucose (hypoglycemia), especially if you use insulin or take certain medicines for diabetes mellitus. Hypoglycemia can be a life-threatening condition. Symptoms of hypoglycemia (sleepiness, dizziness, and disorientation) are similar to symptoms of having too much  alcohol. If your health care provider has given you approval to drink alcohol, do so in moderation and use the following guidelines:  Women should not have more than one drink per day, and men should not have more than two drinks per day. One drink is equal to: ? 12 oz of beer. ? 5 oz of wine. ? 1 oz of hard liquor.  Do not drink on an empty stomach.  Keep yourself hydrated. Have water, diet soda, or unsweetened iced tea.  Regular soda, juice, and other mixers might contain a lot of carbohydrates and should be counted.  What foods are not recommended? As you make food choices, it is important to remember that all foods are not the same. Some foods have fewer nutrients per serving than other foods, even though they might have the same number of calories or carbohydrates. It is difficult to get your body what it needs when you eat foods with fewer nutrients. Examples of foods that you should avoid that are high in calories and carbohydrates but low in nutrients include:  Trans fats (most processed foods list trans fats on the Nutrition Facts label).  Regular soda.  Juice.  Candy.  Sweets, such as cake, pie, doughnuts, and cookies.  Fried foods.  What foods can I eat? Eat nutrient-rich foods, which will nourish your body and keep you healthy. The food you should eat also will depend on  several factors, including:  The calories you need.  The medicines you take.  Your weight.  Your blood glucose level.  Your blood pressure level.  Your cholesterol level.  You should eat a variety of foods, including:  Protein. ? Lean cuts of meat. ? Proteins low in saturated fats, such as fish, egg whites, and beans. Avoid processed meats.  Fruits and vegetables. ? Fruits and vegetables that may help control blood glucose levels, such as apples, mangoes, and yams.  Dairy products. ? Choose fat-free or low-fat dairy products, such as milk, yogurt, and cheese.  Grains, bread, pasta,  and rice. ? Choose whole grain products, such as multigrain bread, whole oats, and brown rice. These foods may help control blood pressure.  Fats. ? Foods containing healthful fats, such as nuts, avocado, olive oil, canola oil, and fish.  Does everyone with diabetes mellitus have the same meal plan? Because every person with diabetes mellitus is different, there is not one meal plan that works for everyone. It is very important that you meet with a dietitian who will help you create a meal plan that is just right for you. This information is not intended to replace advice given to you by your health care provider. Make sure you discuss any questions you have with your health care provider. Document Released: 11/19/2004 Document Revised: 07/31/2015 Document Reviewed: 01/19/2013 Elsevier Interactive Patient Education  2017 [employer]

## 2016-09-15 LAB — LIPID PANEL
CHOL/HDL RATIO: 5.4 ratio — AB (ref 0.0–5.0)
Cholesterol, Total: 172 mg/dL (ref 100–199)
HDL: 32 mg/dL — ABNORMAL LOW (ref >39)
TRIGLYCERIDES: 438 mg/dL — AB (ref 0–149)

## 2016-09-15 LAB — COMPREHENSIVE METABOLIC PANEL
A/G RATIO: 1.8 (ref 1.2–2.2)
ALT: 28 IU/L (ref 0–44)
AST: 25 IU/L (ref 0–40)
Albumin: 4.9 g/dL (ref 3.5–5.5)
Alkaline Phosphatase: 53 IU/L (ref 39–117)
BUN/Creatinine Ratio: 14 (ref 9–20)
BUN: 13 mg/dL (ref 6–24)
Bilirubin Total: 0.7 mg/dL (ref 0.0–1.2)
CALCIUM: 10.2 mg/dL (ref 8.7–10.2)
CO2: 18 mmol/L — ABNORMAL LOW (ref 20–29)
Chloride: 101 mmol/L (ref 96–106)
Creatinine, Ser: 0.96 mg/dL (ref 0.76–1.27)
GFR calc Af Amer: 108 mL/min/{1.73_m2} (ref >59)
GFR, EST NON AFRICAN AMERICAN: 93 mL/min/{1.73_m2} (ref >59)
Globulin, Total: 2.8 g/dL (ref 1.5–4.5)
Glucose: 119 mg/dL — ABNORMAL HIGH (ref 65–99)
POTASSIUM: 4.2 mmol/L (ref 3.5–5.2)
Sodium: 141 mmol/L (ref 134–144)
Total Protein: 7.7 g/dL (ref 6.0–8.5)

## 2016-09-16 ENCOUNTER — Telehealth: Payer: Self-pay | Admitting: Family Medicine

## 2016-09-16 NOTE — Telephone Encounter (Signed)
Pt is returning call.  CB#[phone]/MW

## 2016-10-28 ENCOUNTER — Telehealth: Payer: Self-pay

## 2016-10-28 DIAGNOSIS — F5101 Primary insomnia: Secondary | ICD-10-CM

## 2016-10-28 MED ORDER — LORAZEPAM 1 MG PO TABS: ORAL_TABLET | ORAL | 2 refills | Status: DC

## 2016-10-28 NOTE — Telephone Encounter (Signed)
Patient is requesting a refill on LORazepam (ATIVAN) 1 MG tablet be called in at [facility] pharmacy. Last RF 07/30/16. Last OV 09/07/16. CB# [phone].

## 2016-10-28 NOTE — Telephone Encounter (Signed)
Rx called in to pharmacy. 

## 2016-10-28 NOTE — Telephone Encounter (Signed)
Please call in lorazepam.  

## 2016-12-10 ENCOUNTER — Encounter: Payer: Self-pay | Admitting: Family Medicine

## 2016-12-10 ENCOUNTER — Ambulatory Visit (INDEPENDENT_AMBULATORY_CARE_PROVIDER_SITE_OTHER): Payer: BC Managed Care – PPO | Admitting: Family Medicine

## 2016-12-10 VITALS — BP 120/70 | HR 84 | Temp 98.6°F | Resp 16 | Wt 238.0 lb

## 2016-12-10 DIAGNOSIS — I10 Essential (primary) hypertension: Secondary | ICD-10-CM

## 2016-12-10 DIAGNOSIS — E782 Mixed hyperlipidemia: Secondary | ICD-10-CM | POA: Diagnosis not present

## 2016-12-10 DIAGNOSIS — E119 Type 2 diabetes mellitus without complications: Secondary | ICD-10-CM | POA: Diagnosis not present

## 2016-12-10 LAB — POCT GLYCOSYLATED HEMOGLOBIN (HGB A1C)
Est. average glucose Bld gHb Est-mCnc: 146
Hemoglobin A1C: 6.7

## 2016-12-10 NOTE — Progress Notes (Signed)
Patient: David Baldwin Male    DOB: 14-Oct-1970   46 y.o.   MRN: [id number] Visit Date: 12/10/2016  Today's Provider: Mila Merry, MD   Chief Complaint  Patient presents with  . Follow-up   Subjective:    HPI   Diabetes Mellitus Type II, Follow-up:   Lab Results  Component Value Date   HGBA1C 6.7 12/10/2016   HGBA1C 7.0 09/07/2016   HGBA1C 7.1 (H) 05/31/2016   Last seen for diabetes 3 months ago.  Management since then includes; no changes. He reports good compliance with treatment. He is not having side effects. none Current symptoms include none and have been unchanged. Home blood sugar records: fasting range: not checking  Episodes of hypoglycemia? no   Current Insulin Regimen: n/a Most Recent Eye Exam: due Weight trend: stable Prior visit with dietician: no Current diet: well balanced Current exercise: walking  ------------------------------------------------------------------------   Hypertension, follow-up:  BP Readings from Last 3 Encounters:  12/10/16 120/70  09/07/16 128/82  05/31/16 110/76    He was last seen for hypertension 3 months ago.  BP at that visit was 128/82. Management since that visit includes; no changes.He reports good compliance with treatment. He is not having side effects. none He is exercising. He is adherent to low salt diet.   Outside blood pressures are not checking. He is experiencing none.  Patient denies none.   Cardiovascular risk factors include diabetes mellitus.  Use of agents associated with hypertension: none.   ------------------------------------------------------------------------    Lipid/Cholesterol, Follow-up:   Last seen for this 3 months ago.  Management since that visit includes; labs checked, recommended he cut back on saturated fats..  Last Lipid Panel:    Component Value Date/Time   CHOL 172 09/14/2016 0807   CHOL 228 04/04/2014   TRIG 438 (H) 09/14/2016 0807   TRIG 550 04/04/2014    HDL 32 (L) 09/14/2016 0807   CHOLHDL 5.4 (H) 09/14/2016 0807   LDLCALC Comment 09/14/2016 0807    He reports good compliance with treatment. He is not having side effects. none  Wt Readings from Last 3 Encounters:  12/10/16 238 lb (108 kg)  09/07/16 239 lb (108.4 kg)  05/31/16 244 lb (110.7 kg)    ----------------------------------------------------------------     Allergies  Allergen Reactions  . Zolpidem Tartrate Other (See Comments)     Current Outpatient Prescriptions:  .  cyclobenzaprine (FLEXERIL) 5 MG tablet, Take 1 tablet (5 mg total) by mouth 3 (three) times daily as needed for muscle spasms., Disp: 30 tablet, Rfl: 0 .  dapagliflozin propanediol (FARXIGA) 10 MG TABS tablet, Take 10 mg by mouth daily., Disp: 30 tablet, Rfl: 5 .  glipiZIDE (GLUCOTROL) 5 MG tablet, Take 1 tablet (5 mg total) by mouth daily before breakfast., Disp: 90 tablet, Rfl: 2 .  glucose blood test strip, 1 strip by Other route daily., Disp: , Rfl:  .  lisinopril-hydrochlorothiazide (PRINZIDE,ZESTORETIC) 10-12.5 MG tablet, Take 1 tablet by mouth daily., Disp: 90 tablet, Rfl: 3 .  LORazepam (ATIVAN) 1 MG tablet, TAKE 1/2 TO 1 (ONE-HALF TO ONE) TABLET BY MOUTH AT BEDTIME AS NEEDED, Disp: 30 tablet, Rfl: 2 .  meloxicam (MOBIC) 15 MG tablet, Take 1 tablet (15 mg total) by mouth daily., Disp: 30 tablet, Rfl: 0 .  metFORMIN (GLUCOPHAGE-XR) 500 MG 24 hr tablet, TAKE TWO TABLETS BY MOUTH TWICE DAILY, Disp: 120 tablet, Rfl: 12 .  Naproxen Sodium 220 MG CAPS, Take 1 capsule by mouth daily  as needed., Disp: , Rfl:  .  pravastatin (PRAVACHOL) 80 MG tablet, Take 1 tablet (80 mg total) by mouth daily., Disp: 90 tablet, Rfl: 4  Review of Systems  Constitutional: Negative for appetite change, chills and fever.  Respiratory: Negative for chest tightness, shortness of breath and wheezing.   Cardiovascular: Negative for chest pain and palpitations.  Gastrointestinal: Negative for abdominal pain, nausea and  vomiting.    Social History  Substance Use Topics  . Smoking status: Never Smoker  . Smokeless tobacco: Never Used  . Alcohol use No   Objective:   BP 120/70 (BP Location: Right Arm, Patient Position: Sitting, Cuff Size: Large)   Pulse 84   Temp 98.6 F (37 C) (Oral)   Resp 16   Wt 238 lb (108 kg)   SpO2 94%   BMI 36.19 kg/m  Vitals:   12/10/16 1654  BP: 120/70  Pulse: 84  Resp: 16  Temp: 98.6 F (37 C)  TempSrc: Oral  SpO2: 94%  Weight: 238 lb (108 kg)     Physical Exam   General Appearance:    Alert, cooperative, no distress  Eyes:    PERRL, conjunctiva/corneas clear, EOM's intact       Lungs:     Clear to auscultation bilaterally, respirations unlabored  Heart:    Regular rate and rhythm  Neurologic:   Awake, alert, oriented x 3. No apparent focal neurological           defect.       Results for orders placed or performed in visit on 12/10/16  POCT glycosylated hemoglobin (Hb A1C)  Result Value Ref Range   Hemoglobin A1C 6.7    Est. average glucose Bld gHb Est-mCnc 146        Assessment & Plan:     1. Diabetes mellitus without complication (HCC) Well controlled.  Continue current medications.   - POCT glycosylated hemoglobin (Hb A1C)  2. Essential hypertension Well controlled.  Continue current medications.    3. Hyperlipemia, mixed Compliant with and tolerating pravastatin. Advised to fast for 12 hours before having blood drawn.  - Lipid panel       Mila Merry, MD  [facility] \

## 2016-12-10 NOTE — Patient Instructions (Signed)
Please go to the [facility] lab draw center in [address] on the second floor of [facility]   

## 2016-12-14 LAB — LIPID PANEL
Cholesterol: 164 mg/dL (ref <200)
HDL: 33 mg/dL — ABNORMAL LOW (ref >40)
LDL CHOLESTEROL (CALC): 90 mg/dL
Non-HDL Cholesterol (Calc): 131 mg/dL (calc) — ABNORMAL HIGH (ref <130)
Total CHOL/HDL Ratio: 5 (calc) — ABNORMAL HIGH (ref <5.0)
Triglycerides: 306 mg/dL — ABNORMAL HIGH (ref <150)

## 2017-01-11 ENCOUNTER — Other Ambulatory Visit: Payer: Self-pay | Admitting: Family Medicine

## 2017-01-11 MED ORDER — DAPAGLIFLOZIN PROPANEDIOL 10 MG PO TABS: 10.0000 mg | ORAL_TABLET | Freq: Every day | ORAL | 11 refills | Status: DC

## 2017-01-11 NOTE — Telephone Encounter (Signed)
Pt contacted office for refill request on the following medications:  dapagliflozin propanediol (FARXIGA) 10 MG TABS tablet   [facility] [address]  Last Rx: May 2018 with 5 refills. Please advise. Thanks TNP

## 2017-01-11 NOTE — Telephone Encounter (Signed)
Please review. Thanks!  

## 2017-01-31 ENCOUNTER — Other Ambulatory Visit: Payer: Self-pay | Admitting: Family Medicine

## 2017-01-31 DIAGNOSIS — F5101 Primary insomnia: Secondary | ICD-10-CM

## 2017-01-31 MED ORDER — LORAZEPAM 1 MG PO TABS: ORAL_TABLET | ORAL | 2 refills | Status: DC

## 2017-01-31 NOTE — Telephone Encounter (Signed)
Patient needs a refill on LORazepam (ATIVAN) 1 MG tablet.  [facility] [address]

## 2017-01-31 NOTE — Addendum Note (Signed)
Addended by: Mila MerryFISHER, Tawnia Schirm E on: 01/31/2017 10:05 AM   Modules accepted: Orders

## 2017-04-18 ENCOUNTER — Encounter: Payer: Self-pay | Admitting: Family Medicine

## 2017-04-18 ENCOUNTER — Ambulatory Visit (INDEPENDENT_AMBULATORY_CARE_PROVIDER_SITE_OTHER): Payer: BC Managed Care – PPO | Admitting: Family Medicine

## 2017-04-18 VITALS — BP 122/80 | HR 76 | Resp 16 | Ht 68.0 in | Wt 237.8 lb

## 2017-04-18 DIAGNOSIS — I10 Essential (primary) hypertension: Secondary | ICD-10-CM | POA: Diagnosis not present

## 2017-04-18 DIAGNOSIS — E119 Type 2 diabetes mellitus without complications: Secondary | ICD-10-CM | POA: Diagnosis not present

## 2017-04-18 LAB — POCT GLYCOSYLATED HEMOGLOBIN (HGB A1C): HEMOGLOBIN A1C: 6.4

## 2017-04-18 NOTE — Progress Notes (Signed)
Patient: David Baldwin Male    DOB: 01/14/1971   47 y.o.   MRN: [id number] Visit Date: 04/18/2017  Today's Provider: Mila Merryonald Fisher, MD   No chief complaint on file.  Subjective:    HPI   Diabetes Mellitus Type II, Follow-up:   Lab Results  Component Value Date   HGBA1C 6.7 12/10/2016   HGBA1C 7.0 09/07/2016   HGBA1C 7.1 (H) 05/31/2016   Last seen for diabetes 4 months ago.  Management since then includes; no changes. He reports good compliance with treatment. He is not having side effects.   Home blood sugar records: not being checked.   Episodes of hypoglycemia? no    Weight trend: stable Prior visit with dietician: no Current diet: not asked Current exercise: none  ------------------------------------------------------------------------   Hypertension, follow-up:  BP Readings from Last 3 Encounters:  12/10/16 120/70  09/07/16 128/82  05/31/16 110/76    He was last seen for hypertension 4 months ago.  BP at that visit was 120/70. Management since that visit includes; no changes.He reports excellent compliance with treatment. He is not having side effects.  He is exercising. He is adherent to low salt diet.   Outside blood pressures are not being checked. He is experiencing none.   ------------------------------------------------------------------------    Lipid/Cholesterol, Follow-up:   Last seen for this 4 months ago.  Management since that visit includes; no changes.  Last Lipid Panel:    Component Value Date/Time   CHOL 164 12/14/2016 0806   CHOL 172 09/14/2016 0807   CHOL 228 04/04/2014   TRIG 306 (H) 12/14/2016 0806   TRIG 550 04/04/2014   HDL 33 (L) 12/14/2016 0806   HDL 32 (L) 09/14/2016 0807   CHOLHDL 5.0 (H) 12/14/2016 0806   LDLCALC Comment 09/14/2016 0807     Wt Readings from Last 3 Encounters:  12/10/16 238 lb (108 kg)  09/07/16 239 lb (108.4 kg)  05/31/16 244 lb (110.7 kg)     ------------------------------------------------------------------------    Allergies  Allergen Reactions  . Zolpidem Tartrate Other (See Comments)     Current Outpatient Medications:  .  cyclobenzaprine (FLEXERIL) 5 MG tablet, Take 1 tablet (5 mg total) by mouth 3 (three) times daily as needed for muscle spasms., Disp: 30 tablet, Rfl: 0 .  dapagliflozin propanediol (FARXIGA) 10 MG TABS tablet, Take 10 mg daily by mouth., Disp: 30 tablet, Rfl: 11 .  glipiZIDE (GLUCOTROL) 5 MG tablet, Take 1 tablet (5 mg total) by mouth daily before breakfast., Disp: 90 tablet, Rfl: 2 .  glucose blood test strip, 1 strip by Other route daily., Disp: , Rfl:  .  lisinopril-hydrochlorothiazide (PRINZIDE,ZESTORETIC) 10-12.5 MG tablet, Take 1 tablet by mouth daily., Disp: 90 tablet, Rfl: 3 .  LORazepam (ATIVAN) 1 MG tablet, TAKE 1/2 TO 1 (ONE-HALF TO ONE) TABLET BY MOUTH AT BEDTIME AS NEEDED, Disp: 30 tablet, Rfl: 2 .  meloxicam (MOBIC) 15 MG tablet, Take 1 tablet (15 mg total) by mouth daily., Disp: 30 tablet, Rfl: 0 .  metFORMIN (GLUCOPHAGE-XR) 500 MG 24 hr tablet, TAKE TWO TABLETS BY MOUTH TWICE DAILY, Disp: 120 tablet, Rfl: 12 .  Naproxen Sodium 220 MG CAPS, Take 1 capsule by mouth daily as needed., Disp: , Rfl:  .  pravastatin (PRAVACHOL) 80 MG tablet, Take 1 tablet (80 mg total) by mouth daily., Disp: 90 tablet, Rfl: 4  Review of Systems  Constitutional: Negative.  Negative for appetite change, chills and fever.  HENT: Negative.  Eyes: Negative.   Respiratory: Negative.  Negative for chest tightness, shortness of breath and wheezing.   Cardiovascular: Negative.  Negative for chest pain and palpitations.  Gastrointestinal: Negative.  Negative for abdominal pain, nausea and vomiting.  Endocrine: Negative.   Genitourinary: Negative.   Musculoskeletal: Negative.   Skin: Negative.   Allergic/Immunologic: Negative.   Neurological: Negative.   Hematological: Negative.   Psychiatric/Behavioral:  Negative.   All other systems reviewed and are negative.   Social History   Tobacco Use  . Smoking status: Never Smoker  . Smokeless tobacco: Never Used  Substance Use Topics  . Alcohol use: No    Alcohol/week: 0.0 oz   Objective:   BP 122/80   Pulse 76   Resp 16   Ht 5\' 8"  (1.727 m)   Wt 237 lb 12.8 oz (107.9 kg)   SpO2 96%   BMI 36.16 kg/m     Physical Exam  General Appearance:    Alert, cooperative, no distress, obese  Eyes:    PERRL, conjunctiva/corneas clear, EOM's intact       Lungs:     Clear to auscultation bilaterally, respirations unlabored  Heart:    Regular rate and rhythm  Neurologic:   Awake, alert, oriented x 3. No apparent focal neurological           defect.         Results for orders placed or performed in visit on 04/18/17  POCT HgB A1C  Result Value Ref Range   Hemoglobin A1C 6.4        Assessment & Plan:     1. Type 2 diabetes mellitus without complication, unspecified whether long term insulin use (HCC) Well controlled.  Continue current medications.   - POCT HgB A1C  2. Essential hypertension Well controlled.  Continue current medications.           Mila Merry, MD  [facility]

## 2017-04-26 ENCOUNTER — Other Ambulatory Visit: Payer: Self-pay | Admitting: Family Medicine

## 2017-04-26 MED ORDER — LORAZEPAM 1 MG PO TABS: 1.0000 mg | ORAL_TABLET | Freq: Three times a day (TID) | ORAL | 5 refills | Status: DC

## 2017-04-26 NOTE — Telephone Encounter (Signed)
Patient needs refills on Lorazepam 1 mg. [facility] [address]

## 2017-05-18 ENCOUNTER — Other Ambulatory Visit: Payer: Self-pay | Admitting: Family Medicine

## 2017-05-18 MED ORDER — GLIPIZIDE 5 MG PO TABS: 5.0000 mg | ORAL_TABLET | Freq: Every day | ORAL | 4 refills | Status: DC

## 2017-05-18 MED ORDER — LISINOPRIL-HYDROCHLOROTHIAZIDE 10-12.5 MG PO TABS: 1.0000 | ORAL_TABLET | Freq: Every day | ORAL | 4 refills | Status: DC

## 2017-05-18 NOTE — Telephone Encounter (Signed)
Pt contacted office for refill request on the following medications:  1. lisinopril-hydrochlorothiazide (PRINZIDE,ZESTORETIC) 10-12.5 MG tablet  Last Rx: 08/27/16 2. glipiZIDE (GLUCOTROL) 5 MG tablet Last Rx: 06/01/16  90 DAY SUPPLY  [facility] [address]  LOV: 04/18/17 Please advise. Thanks TNP

## 2017-05-30 ENCOUNTER — Other Ambulatory Visit: Payer: Self-pay | Admitting: Family Medicine

## 2017-05-30 DIAGNOSIS — IMO0001 Reserved for inherently not codable concepts without codable children: Secondary | ICD-10-CM

## 2017-05-30 DIAGNOSIS — E1165 Type 2 diabetes mellitus with hyperglycemia: Principal | ICD-10-CM

## 2017-05-30 MED ORDER — METFORMIN HCL ER 500 MG PO TB24: 1000.0000 mg | ORAL_TABLET | Freq: Two times a day (BID) | ORAL | 12 refills | Status: DC

## 2017-05-30 NOTE — Telephone Encounter (Signed)
Pt requesting refill of Metformin 500 MG [facility] [address]

## 2017-07-12 LAB — HM DIABETES EYE EXAM

## 2017-07-14 ENCOUNTER — Encounter: Payer: Self-pay | Admitting: *Deleted

## 2017-08-19 ENCOUNTER — Other Ambulatory Visit: Payer: Self-pay | Admitting: Family Medicine

## 2017-08-19 DIAGNOSIS — E782 Mixed hyperlipidemia: Secondary | ICD-10-CM

## 2017-08-19 MED ORDER — PRAVASTATIN SODIUM 80 MG PO TABS: 80.0000 mg | ORAL_TABLET | Freq: Every day | ORAL | 4 refills | Status: DC

## 2017-08-19 NOTE — Telephone Encounter (Signed)
pt needs refill on his pravastin 80 mg   [facility] [address]  Thanks Fortune Brandsteri

## 2017-09-15 ENCOUNTER — Ambulatory Visit: Payer: BC Managed Care – PPO | Admitting: Family Medicine

## 2017-09-21 ENCOUNTER — Ambulatory Visit: Payer: BC Managed Care – PPO | Admitting: Family Medicine

## 2017-09-21 ENCOUNTER — Encounter: Payer: Self-pay | Admitting: Family Medicine

## 2017-09-21 VITALS — BP 110/70 | HR 94 | Temp 98.7°F | Resp 16 | Ht 68.0 in | Wt 230.0 lb

## 2017-09-21 DIAGNOSIS — I10 Essential (primary) hypertension: Secondary | ICD-10-CM

## 2017-09-21 DIAGNOSIS — E782 Mixed hyperlipidemia: Secondary | ICD-10-CM

## 2017-09-21 DIAGNOSIS — E119 Type 2 diabetes mellitus without complications: Secondary | ICD-10-CM | POA: Diagnosis not present

## 2017-09-21 NOTE — Progress Notes (Signed)
Patient: David Baldwin Male    DOB: 11/08/1970   47 y.o.   MRN: [id number] Visit Date: 09/21/2017  Today's Provider: Mila Merry, MD   Chief Complaint  Patient presents with  . Diabetes  . Hypertension   Subjective:    HPI   Diabetes Mellitus Type II, Follow-up:   Lab Results  Component Value Date   HGBA1C 6.4 04/18/2017   HGBA1C 6.7 12/10/2016   HGBA1C 7.0 09/07/2016   Last seen for diabetes 5 months ago.  Management since then includes; no changes. He reports good compliance with treatment. He is not having side effects. none Current symptoms include none and have been unchanged. Home blood sugar records: fasting range: not checking  Episodes of hypoglycemia? no   Current Insulin Regimen: n/a Most Recent Eye Exam: 07/12/17 Weight trend: stable Prior visit with dietician: no Current diet: well balanced Current exercise: none  ------------------------------------------------------------------------   Hypertension, follow-up:  BP Readings from Last 3 Encounters:  09/21/17 110/70  04/18/17 122/80  12/10/16 120/70    He was last seen for hypertension 5 months ago.  BP at that visit was 122/80. Management since that visit includes; no changes.He reports good compliance with treatment. He is not having side effects. none He is not exercising. He is adherent to low salt diet.   Outside blood pressures are not checking. He is experiencing none.  Patient denies none.   Cardiovascular risk factors include diabetes.  Use of agents associated with hypertension: none.  --------------------------------------------------------------- Follow up hyperlipidemia Lab Results  Component Value Date   CHOL 164 12/14/2016   HDL 33 (L) 12/14/2016   LDLCALC 90 12/14/2016   TRIG 306 (H) 12/14/2016   CHOLHDL 5.0 (H) 12/14/2016   He reports he takes pravastatin consistently and is well tolerated.    Allergies  Allergen Reactions  . Zolpidem Tartrate Other (See  Comments)     Current Outpatient Medications:  .  dapagliflozin propanediol (FARXIGA) 10 MG TABS tablet, Take 10 mg daily by mouth., Disp: 30 tablet, Rfl: 11 .  glipiZIDE (GLUCOTROL) 5 MG tablet, Take 1 tablet (5 mg total) by mouth daily before breakfast., Disp: 90 tablet, Rfl: 4 .  glucose blood test strip, 1 strip by Other route daily., Disp: , Rfl:  .  lisinopril-hydrochlorothiazide (PRINZIDE,ZESTORETIC) 10-12.5 MG tablet, Take 1 tablet by mouth daily., Disp: 90 tablet, Rfl: 4 .  LORazepam (ATIVAN) 1 MG tablet, Take 1 tablet (1 mg total) by mouth every 8 (eight) hours., Disp: 30 tablet, Rfl: 5 .  metFORMIN (GLUCOPHAGE-XR) 500 MG 24 hr tablet, Take 2 tablets (1,000 mg total) by mouth 2 (two) times daily., Disp: 120 tablet, Rfl: 12 .  Naproxen Sodium 220 MG CAPS, Take 1 capsule by mouth daily as needed., Disp: , Rfl:  .  pravastatin (PRAVACHOL) 80 MG tablet, Take 1 tablet (80 mg total) by mouth daily., Disp: 90 tablet, Rfl: 4  Review of Systems  Constitutional: Negative for appetite change, chills and fever.  Respiratory: Negative for chest tightness, shortness of breath and wheezing.   Cardiovascular: Negative for chest pain and palpitations.  Gastrointestinal: Negative for abdominal pain, nausea and vomiting.    Social History   Tobacco Use  . Smoking status: Never Smoker  . Smokeless tobacco: Never Used  Substance Use Topics  . Alcohol use: No    Alcohol/week: 0.0 oz   Objective:   BP 110/70 (BP Location: Right Arm, Patient Position: Sitting, Cuff Size: Large)   Pulse  94   Temp 98.7 F (37.1 C) (Oral)   Resp 16   Ht 5\' 8"  (1.727 m)   Wt 230 lb (104.3 kg)   SpO2 95%   BMI 34.97 kg/m  Vitals:   09/21/17 1632  BP: 110/70  Pulse: 94  Resp: 16  Temp: 98.7 F (37.1 C)  TempSrc: Oral  SpO2: 95%  Weight: 230 lb (104.3 kg)  Height: 5\' 8"  (1.727 m)     Physical Exam  General Appearance:    Alert, cooperative, no distress, obese  Eyes:    PERRL, conjunctiva/corneas  clear, EOM's intact       Lungs:     Clear to auscultation bilaterally, respirations unlabored  Heart:    Regular rate and rhythm  Neurologic:   Awake, alert, oriented x 3. No apparent focal neurological           defect.           Assessment & Plan:     1. Type 2 diabetes mellitus without complication, without long-term current use of insulin (HCC) Doing well current medications.   - Hemoglobin A1c  2. Hyperlipemia, mixed He is tolerating pravastatin well with no adverse effects.  If LDL still not to goal will consider change to rosuvastatin.  - Comprehensive metabolic panel - Lipid panel - CBC  3. Essential hypertension Well controlled.  Continue current medications.   - Comprehensive metabolic panel       Mila Merryonald Fisher, MD  [facility]

## 2017-09-29 ENCOUNTER — Telehealth: Payer: Self-pay

## 2017-09-29 LAB — COMPREHENSIVE METABOLIC PANEL
ALBUMIN: 4.8 g/dL (ref 3.5–5.5)
ALK PHOS: 50 IU/L (ref 39–117)
ALT: 43 IU/L (ref 0–44)
AST: 39 IU/L (ref 0–40)
Albumin/Globulin Ratio: 1.8 (ref 1.2–2.2)
BUN/Creatinine Ratio: 14 (ref 9–20)
BUN: 12 mg/dL (ref 6–24)
Bilirubin Total: 0.5 mg/dL (ref 0.0–1.2)
CALCIUM: 10 mg/dL (ref 8.7–10.2)
CO2: 21 mmol/L (ref 20–29)
CREATININE: 0.87 mg/dL (ref 0.76–1.27)
Chloride: 104 mmol/L (ref 96–106)
GFR calc Af Amer: 117 mL/min/{1.73_m2} (ref >59)
GFR, EST NON AFRICAN AMERICAN: 101 mL/min/{1.73_m2} (ref >59)
GLOBULIN, TOTAL: 2.6 g/dL (ref 1.5–4.5)
GLUCOSE: 85 mg/dL (ref 65–99)
Potassium: 4.1 mmol/L (ref 3.5–5.2)
Sodium: 144 mmol/L (ref 134–144)
Total Protein: 7.4 g/dL (ref 6.0–8.5)

## 2017-09-29 LAB — LIPID PANEL
CHOL/HDL RATIO: 5.4 ratio — AB (ref 0.0–5.0)
CHOLESTEROL TOTAL: 179 mg/dL (ref 100–199)
HDL: 33 mg/dL — ABNORMAL LOW (ref >39)
LDL CALC: 94 mg/dL (ref 0–99)
TRIGLYCERIDES: 261 mg/dL — AB (ref 0–149)
VLDL CHOLESTEROL CAL: 52 mg/dL — AB (ref 5–40)

## 2017-09-29 LAB — CBC
Hematocrit: 54.6 % — ABNORMAL HIGH (ref 37.5–51.0)
Hemoglobin: 17.8 g/dL — ABNORMAL HIGH (ref 13.0–17.7)
MCH: 30.4 pg (ref 26.6–33.0)
MCHC: 32.6 g/dL (ref 31.5–35.7)
MCV: 93 fL (ref 79–97)
PLATELETS: 303 10*3/uL (ref 150–450)
RBC: 5.85 x10E6/uL — ABNORMAL HIGH (ref 4.14–5.80)
RDW: 12.6 % (ref 12.3–15.4)
WBC: 8.3 10*3/uL (ref 3.4–10.8)

## 2017-09-29 LAB — HEMOGLOBIN A1C
Est. average glucose Bld gHb Est-mCnc: 146 mg/dL
HEMOGLOBIN A1C: 6.7 % — AB (ref 4.8–5.6)

## 2017-09-29 NOTE — Telephone Encounter (Signed)
Patient advised. 6 month follow up scheduled.  

## 2017-09-29 NOTE — Telephone Encounter (Signed)
LMTCB

## 2017-09-29 NOTE — Telephone Encounter (Signed)
-----   Message from Malva Limesonald E Fisher, MD sent at 09/29/2017  9:58 AM EDT ----- Cholesterol is stable at 179. a1c is stable at 6.7% rest of labs are normal. Continue current medications.  Follow up for diabetes 6 months.

## 2017-10-10 ENCOUNTER — Other Ambulatory Visit: Payer: Self-pay

## 2017-10-10 MED ORDER — LORAZEPAM 1 MG PO TABS: 1.0000 mg | ORAL_TABLET | Freq: Three times a day (TID) | ORAL | 3 refills | Status: DC

## 2017-10-10 NOTE — Telephone Encounter (Signed)
Patient called requesting refills. Pharmacy listed is correct. Thanks!

## 2017-10-18 IMAGING — CR DG CERVICAL SPINE COMPLETE 4+V
1 series · 8 of 9 positions shown · non-contrast
Comparison: None.

CLINICAL DATA: Posterior neck pain radiating down patient's left
arm. No history of injury.

EXAM:
CERVICAL SPINE - COMPLETE 4+ VIEW

[Series 1: dg cervical spine complete · 0.14mm/px · 8 of 9 slices shown]
[im 1/9]
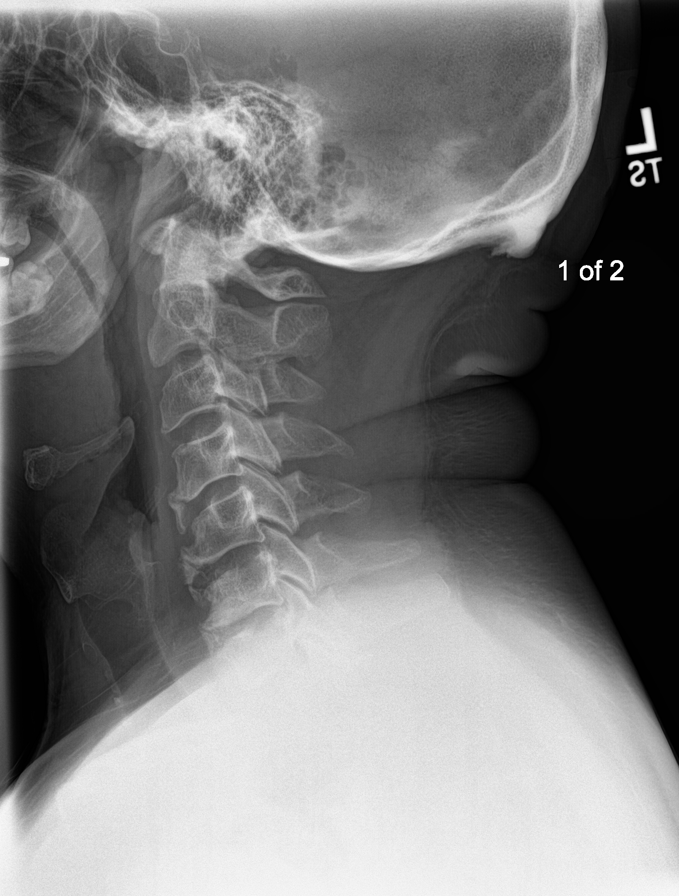
[im 2/9]
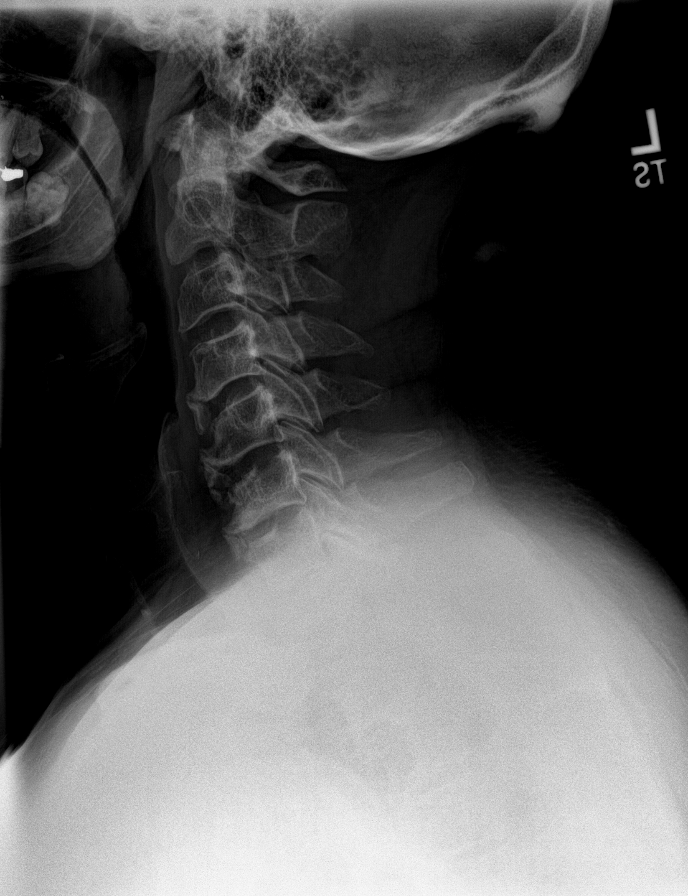
[im 3/9]
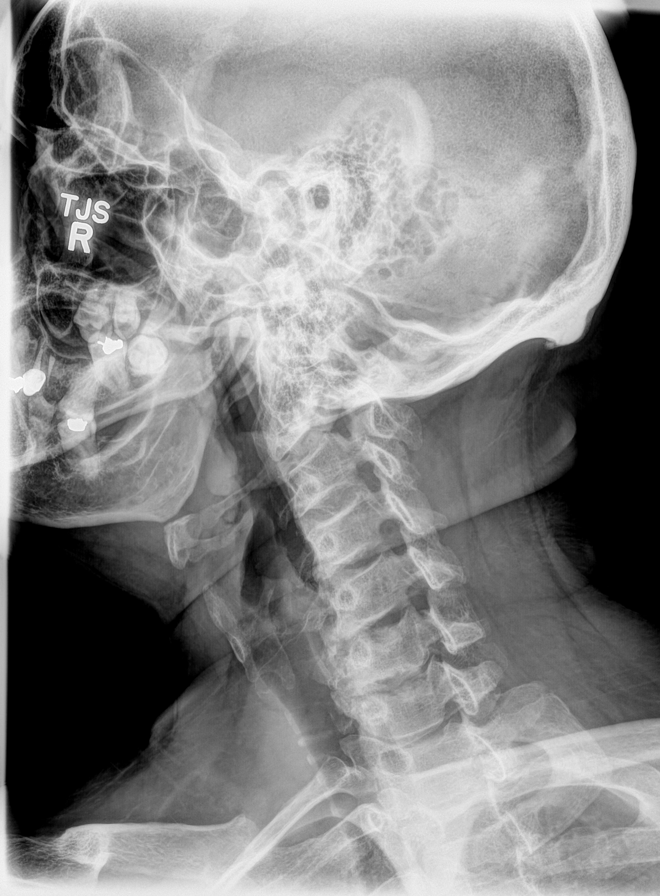
[im 4/9]
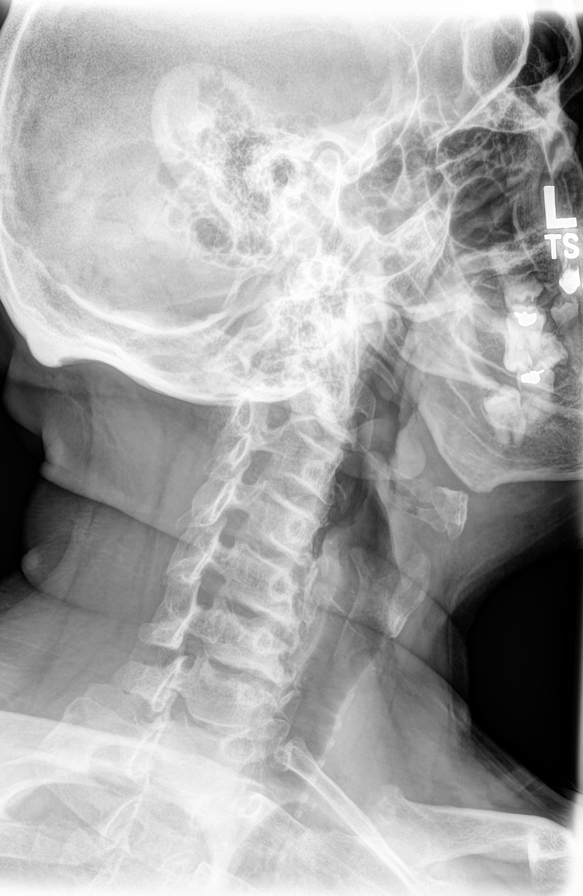
[im 5/9]
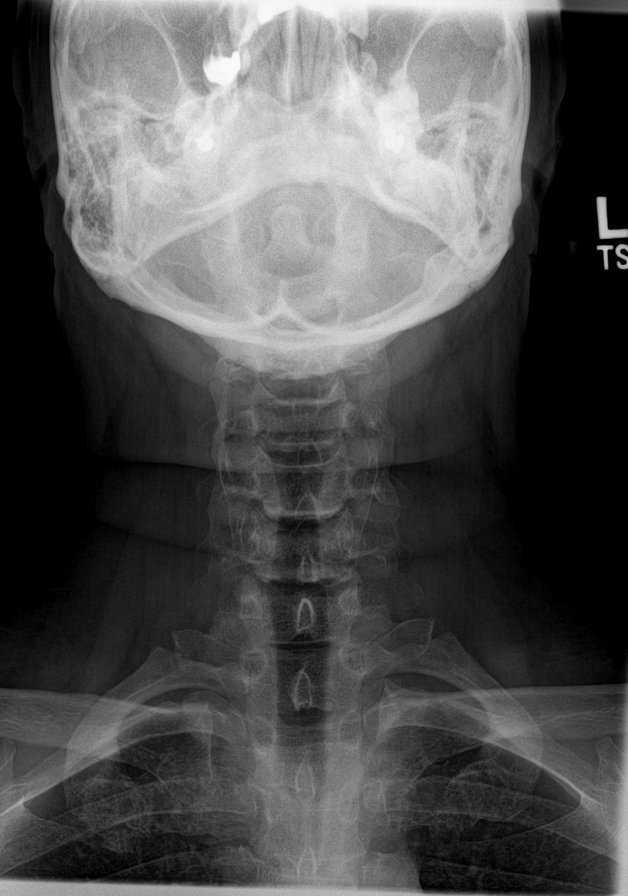
[im 6/9]
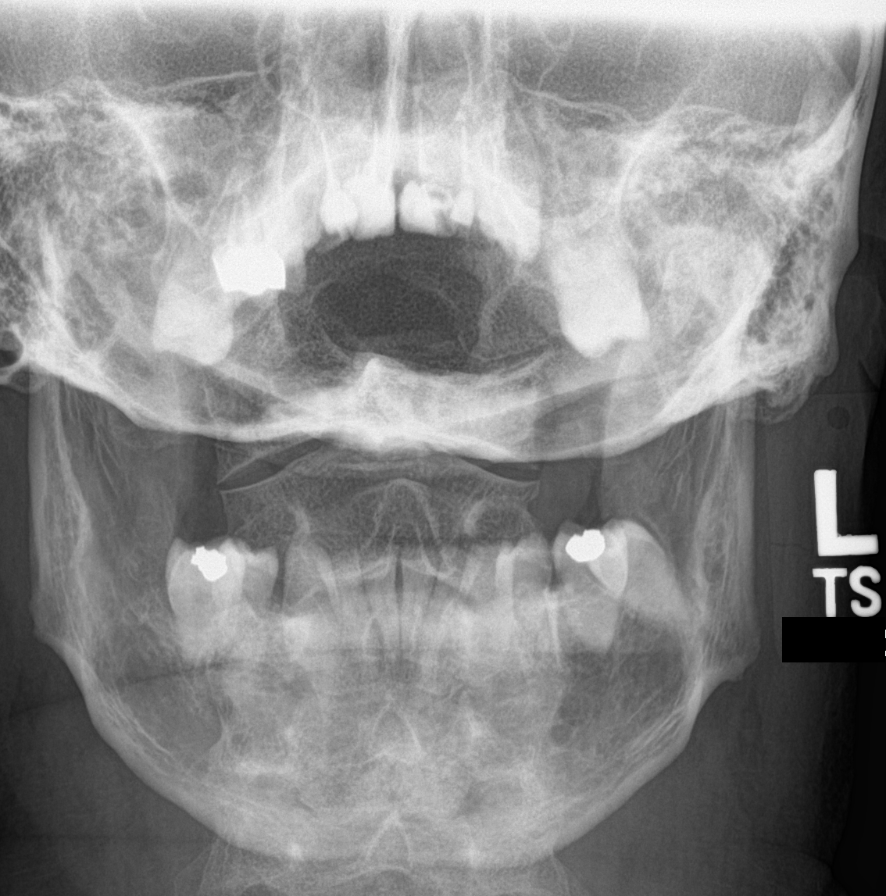
[im 7/9]
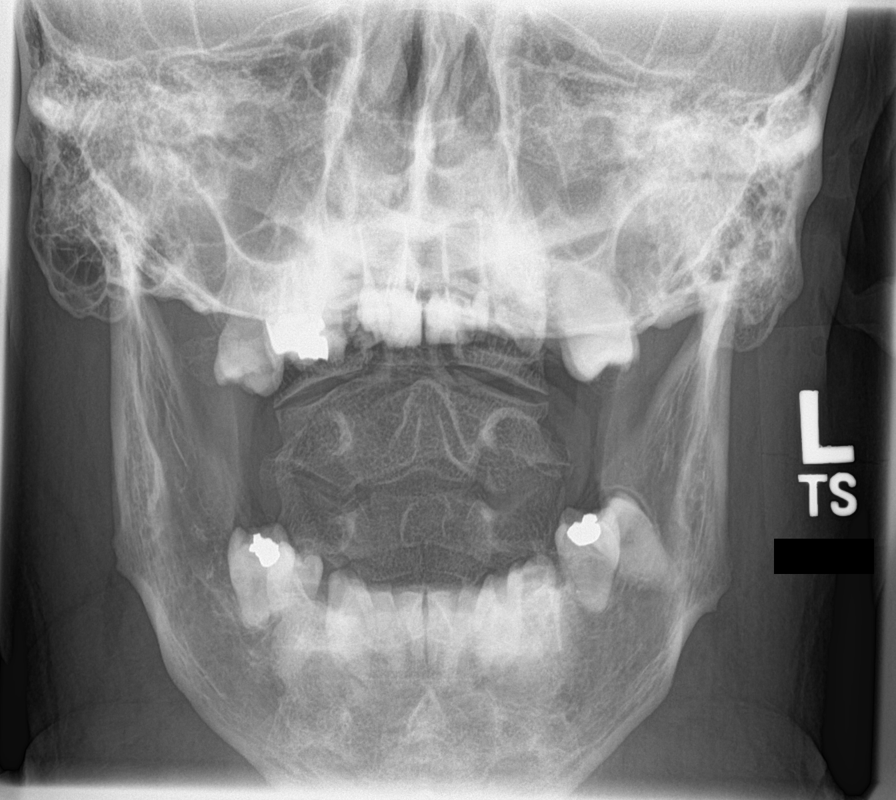
[im 8/9]
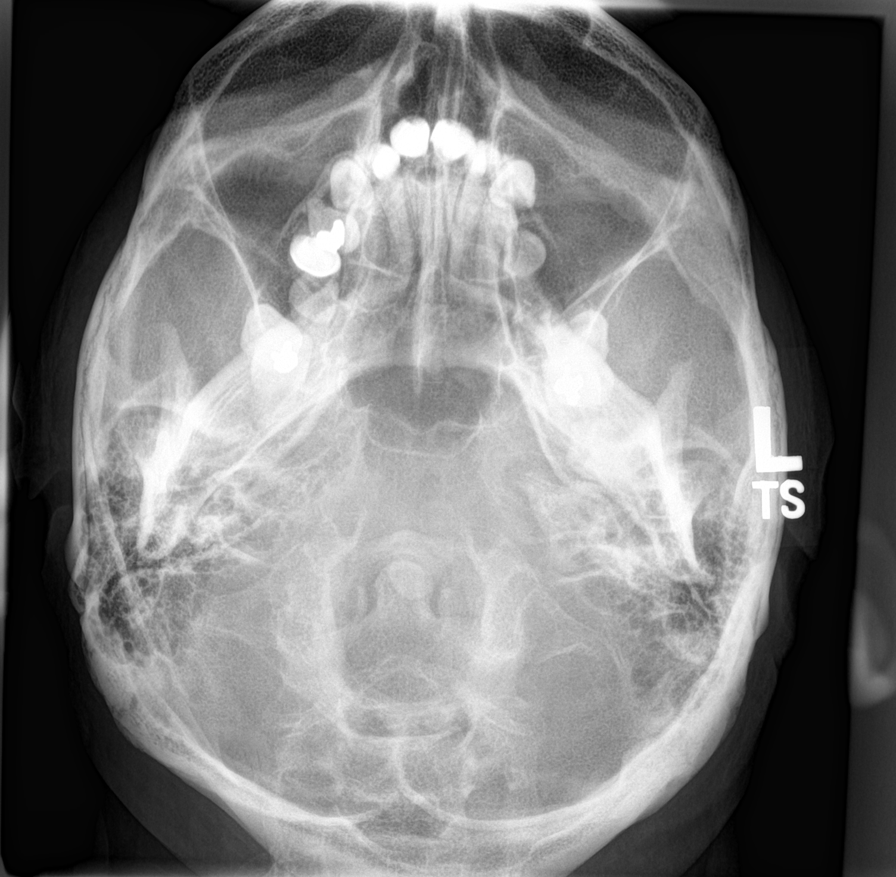

[8 of 9 positions shown; findings below may reference images not displayed]

FINDINGS: There is no evidence of cervical spine fracture or prevertebral soft
tissue swelling. There is straightening of the physiologic cervical
lordosis.

There are multilevel osteoarthritic changes of the cervical spine,
most pronounced in the lower cervical spine with multilevel disc
space narrowing, mild remodeling of the vertebral bodies and large
anterior osteophyte formation. No evidence of bony neural foramina
narrowing, although there is Luschka joint hypertrophy at C7-T1.
IMPRESSION: Multilevel osteoarthritic changes of the cervical spine, moderate in
severity.

## 2018-01-09 ENCOUNTER — Other Ambulatory Visit: Payer: Self-pay | Admitting: Family Medicine

## 2018-01-09 MED ORDER — DAPAGLIFLOZIN PROPANEDIOL 10 MG PO TABS: 10.0000 mg | ORAL_TABLET | Freq: Every day | ORAL | 11 refills | Status: DC

## 2018-01-09 NOTE — Telephone Encounter (Signed)
Pt needs refill on   Generic Farxiga 10 mg  [address]  CB# [phone]  Thanks  Barth Kirks

## 2018-01-09 NOTE — Telephone Encounter (Signed)
Please advise   Thanks,    -Laura  

## 2018-02-09 ENCOUNTER — Other Ambulatory Visit: Payer: Self-pay | Admitting: Family Medicine

## 2018-02-09 MED ORDER — LORAZEPAM 1 MG PO TABS: 1.0000 mg | ORAL_TABLET | Freq: Three times a day (TID) | ORAL | 3 refills | Status: DC

## 2018-02-09 NOTE — Telephone Encounter (Signed)
Pt needing refill on:  LORazepam (ATIVAN) 1 MG tablet  Please fill at:  [facility] [address] - [place] ([place]), [place] - [address] [phone] (Phone) [phone] (Fax)    Thanks, TGh

## 2018-04-03 ENCOUNTER — Encounter: Payer: Self-pay | Admitting: Family Medicine

## 2018-04-03 ENCOUNTER — Ambulatory Visit: Payer: BC Managed Care – PPO | Admitting: Family Medicine

## 2018-04-03 VITALS — BP 130/88 | HR 68 | Temp 98.9°F | Resp 16 | Wt 232.0 lb

## 2018-04-03 DIAGNOSIS — E119 Type 2 diabetes mellitus without complications: Secondary | ICD-10-CM

## 2018-04-03 DIAGNOSIS — I10 Essential (primary) hypertension: Secondary | ICD-10-CM | POA: Diagnosis not present

## 2018-04-03 LAB — POCT GLYCOSYLATED HEMOGLOBIN (HGB A1C)
ESTIMATED AVERAGE GLUCOSE: 148
Hemoglobin A1C: 6.8 % — AB (ref 4.0–5.6)

## 2018-04-03 NOTE — Progress Notes (Signed)
Patient: David Baldwin Male    DOB: [birth year]   48 y.o.   MRN: [id number] Visit Date: 04/03/2018  Today's Provider: Mila Merry, MD   Chief Complaint  Patient presents with  . Diabetes  . Hyperlipidemia  . Hypertension   Subjective:     HPI  Diabetes Mellitus Type II, Follow-up:   Lab Results  Component Value Date   HGBA1C 6.7 (H) 09/28/2017   HGBA1C 6.4 04/18/2017   HGBA1C 6.7 12/10/2016    Last seen for diabetes 6 months ago.  Management since then includes no changes. He reports good compliance with treatment. He is not having side effects.  Current symptoms include none and have been stable. Home blood sugar records: blood sugars are not checked at home  Episodes of hypoglycemia? no   Current Insulin Regimen: none Most Recent Eye Exam: 07/12/2017 Weight trend: stable Prior visit with dietician: no Current diet: well balanced Current exercise: walking  Pertinent Labs:    Component Value Date/Time   CHOL 179 09/28/2017 0802   CHOL 228 04/04/2014   TRIG 261 (H) 09/28/2017 0802   TRIG 550 04/04/2014   HDL 33 (L) 09/28/2017 0802   LDLCALC 94 09/28/2017 0802   LDLCALC 90 12/14/2016 0806   CREATININE 0.87 09/28/2017 0802   CREATININE 0.81 04/04/2014    Wt Readings from Last 3 Encounters:  04/03/18 232 lb (105.2 kg)  09/21/17 230 lb (104.3 kg)  04/18/17 237 lb 12.8 oz (107.9 kg)    ------------------------------------------------------------------------  Lipid/Cholesterol, Follow-up:   Last seen for this 6 months ago.  Management changes since that visit include none. . Last Lipid Panel:    Component Value Date/Time   CHOL 179 09/28/2017 0802   CHOL 228 04/04/2014   TRIG 261 (H) 09/28/2017 0802   TRIG 550 04/04/2014   HDL 33 (L) 09/28/2017 0802   CHOLHDL 5.4 (H) 09/28/2017 0802   CHOLHDL 5.0 (H) 12/14/2016 0806   LDLCALC 94 09/28/2017 0802   LDLCALC 90 12/14/2016 0806    Risk factors for vascular disease include diabetes  mellitus, hypercholesterolemia and hypertension  He reports good compliance with treatment. He is not having side effects.  Current symptoms include none and have been stable. Weight trend: stable Prior visit with dietician: no Current diet: well balanced Current exercise: walking  Wt Readings from Last 3 Encounters:  04/03/18 232 lb (105.2 kg)  09/21/17 230 lb (104.3 kg)  04/18/17 237 lb 12.8 oz (107.9 kg)    -------------------------------------------------------------------  Hypertension, follow-up:  BP Readings from Last 3 Encounters:  04/03/18 130/88  09/21/17 110/70  04/18/17 122/80    He was last seen for hypertension 6 months ago.  BP at that visit was 110/70. Management since that visit includes no changes. He reports good compliance with treatment. He is not having side effects.  He is exercising. He is adherent to low salt diet.   Outside blood pressures are not being checked. He is experiencing none.  Patient denies chest pain, chest pressure/discomfort, claudication, dyspnea, exertional chest pressure/discomfort, fatigue, irregular heart beat, lower extremity edema, near-syncope, orthopnea, palpitations, paroxysmal nocturnal dyspnea, syncope and tachypnea.   Cardiovascular risk factors include diabetes mellitus, dyslipidemia, hypertension and male gender.  Use of agents associated with hypertension: none.     Weight trend: stable Wt Readings from Last 3 Encounters:  04/03/18 232 lb (105.2 kg)  09/21/17 230 lb (104.3 kg)  04/18/17 237 lb 12.8 oz (107.9 kg)    Current diet: well  balanced  ------------------------------------------------------------------------  Allergies  Allergen Reactions  . Zolpidem Tartrate Other (See Comments)     Current Outpatient Medications:  .  dapagliflozin propanediol (FARXIGA) 10 MG TABS tablet, Take 10 mg by mouth daily., Disp: 30 tablet, Rfl: 11 .  glipiZIDE (GLUCOTROL) 5 MG tablet, Take 1 tablet (5 mg total) by  mouth daily before breakfast., Disp: 90 tablet, Rfl: 4 .  glucose blood test strip, 1 strip by Other route daily., Disp: , Rfl:  .  lisinopril-hydrochlorothiazide (PRINZIDE,ZESTORETIC) 10-12.5 MG tablet, Take 1 tablet by mouth daily., Disp: 90 tablet, Rfl: 4 .  LORazepam (ATIVAN) 1 MG tablet, Take 1 tablet (1 mg total) by mouth every 8 (eight) hours., Disp: 30 tablet, Rfl: 3 .  metFORMIN (GLUCOPHAGE-XR) 500 MG 24 hr tablet, Take 2 tablets (1,000 mg total) by mouth 2 (two) times daily., Disp: 120 tablet, Rfl: 12 .  Naproxen Sodium 220 MG CAPS, Take 1 capsule by mouth daily as needed., Disp: , Rfl:  .  pravastatin (PRAVACHOL) 80 MG tablet, Take 1 tablet (80 mg total) by mouth daily., Disp: 90 tablet, Rfl: 4  Review of Systems  Constitutional: Negative for appetite change, chills and fever.  Respiratory: Negative for chest tightness, shortness of breath and wheezing.   Cardiovascular: Negative for chest pain and palpitations.  Gastrointestinal: Negative for abdominal pain, nausea and vomiting.    Social History   Tobacco Use  . Smoking status: Never Smoker  . Smokeless tobacco: Never Used  Substance Use Topics  . Alcohol use: No    Alcohol/week: 0.0 standard drinks      Objective:   BP 130/88 (BP Location: Left Arm, Patient Position: Sitting, Cuff Size: Large)   Pulse 68   Temp 98.9 F (37.2 C) (Oral)   Resp 16   Wt 232 lb (105.2 kg)   SpO2 97% Comment: room air  BMI 35.28 kg/m  Vitals:   04/03/18 1625  BP: 130/88  Pulse: 68  Resp: 16  Temp: 98.9 F (37.2 C)  TempSrc: Oral  SpO2: 97%  Weight: 232 lb (105.2 kg)     Physical Exam  General Appearance:    Alert, cooperative, no distress, obese  Eyes:    PERRL, conjunctiva/corneas clear, EOM's intact       Lungs:     Clear to auscultation bilaterally, respirations unlabored  Heart:    Regular rate and rhythm  Neurologic:   Awake, alert, oriented x 3. No apparent focal neurological           defect.       Results for  orders placed or performed in visit on 04/03/18  POCT HgB A1C  Result Value Ref Range   Hemoglobin A1C 6.8 (A) 4.0 - 5.6 %   HbA1c POC (<> result, manual entry)     HbA1c, POC (prediabetic range)     HbA1c, POC (controlled diabetic range)     Est. average glucose Bld gHb Est-mCnc 148        Assessment & Plan    1. Type 2 diabetes mellitus without complication, without long-term current use of insulin (HCC) Well controlled.  Continue current medications.   - POCT HgB A1C  2. Essential hypertension Well controlled.  Continue current medications.    Follow up 6 months.     Mila Merry, MD  [facility]

## 2018-04-03 NOTE — Patient Instructions (Signed)
.   Please review the attached list of medications and notify my office if there are any errors.   . Please bring all of your medications to every appointment so we can make sure that our medication list is the same as yours.   

## 2018-05-31 ENCOUNTER — Other Ambulatory Visit: Payer: Self-pay

## 2018-05-31 DIAGNOSIS — E1165 Type 2 diabetes mellitus with hyperglycemia: Principal | ICD-10-CM

## 2018-05-31 DIAGNOSIS — IMO0001 Reserved for inherently not codable concepts without codable children: Secondary | ICD-10-CM

## 2018-05-31 MED ORDER — METFORMIN HCL ER 500 MG PO TB24: 1000.0000 mg | ORAL_TABLET | Freq: Two times a day (BID) | ORAL | 6 refills | Status: DC

## 2018-05-31 NOTE — Telephone Encounter (Signed)
Done prescription sent into pharmacy per protocol.

## 2018-05-31 NOTE — Telephone Encounter (Signed)
Patient is requesting a refill on Metformin be sent to [facility] pharmacy.

## 2018-06-13 ENCOUNTER — Other Ambulatory Visit: Payer: Self-pay

## 2018-06-13 MED ORDER — LORAZEPAM 1 MG PO TABS: 1.0000 mg | ORAL_TABLET | Freq: Three times a day (TID) | ORAL | 3 refills | Status: DC

## 2018-06-13 NOTE — Telephone Encounter (Signed)
Patient is requesting refill on Lorazepam 1 mg. L.O.V. 04/03/2018, please advise.

## 2018-06-26 ENCOUNTER — Telehealth: Payer: Self-pay

## 2018-06-26 DIAGNOSIS — E1165 Type 2 diabetes mellitus with hyperglycemia: Principal | ICD-10-CM

## 2018-06-26 DIAGNOSIS — IMO0001 Reserved for inherently not codable concepts without codable children: Secondary | ICD-10-CM

## 2018-06-26 MED ORDER — METFORMIN HCL ER 500 MG PO TB24: 1000.0000 mg | ORAL_TABLET | Freq: Two times a day (BID) | ORAL | 6 refills | Status: DC

## 2018-06-26 NOTE — Telephone Encounter (Signed)
Patient needs a refill on Metformin

## 2018-06-26 NOTE — Addendum Note (Signed)
Addended by: Malva Limes on: 06/26/2018 10:41 AM   Modules accepted: Orders

## 2018-08-09 ENCOUNTER — Other Ambulatory Visit: Payer: Self-pay

## 2018-08-10 MED ORDER — LISINOPRIL-HYDROCHLOROTHIAZIDE 10-12.5 MG PO TABS: 1.0000 | ORAL_TABLET | Freq: Every day | ORAL | 4 refills | Status: DC

## 2018-08-10 MED ORDER — GLIPIZIDE 5 MG PO TABS: 5.0000 mg | ORAL_TABLET | Freq: Every day | ORAL | 4 refills | Status: DC

## 2018-10-02 ENCOUNTER — Ambulatory Visit: Payer: BC Managed Care – PPO | Admitting: Family Medicine

## 2018-10-02 ENCOUNTER — Other Ambulatory Visit: Payer: Self-pay

## 2018-10-02 ENCOUNTER — Encounter: Payer: Self-pay | Admitting: Family Medicine

## 2018-10-02 VITALS — BP 112/76 | HR 95 | Temp 99.1°F | Resp 18 | Wt 224.0 lb

## 2018-10-02 DIAGNOSIS — Z1211 Encounter for screening for malignant neoplasm of colon: Secondary | ICD-10-CM | POA: Diagnosis not present

## 2018-10-02 DIAGNOSIS — Z1159 Encounter for screening for other viral diseases: Secondary | ICD-10-CM

## 2018-10-02 DIAGNOSIS — I10 Essential (primary) hypertension: Secondary | ICD-10-CM

## 2018-10-02 DIAGNOSIS — E782 Mixed hyperlipidemia: Secondary | ICD-10-CM

## 2018-10-02 DIAGNOSIS — E1169 Type 2 diabetes mellitus with other specified complication: Secondary | ICD-10-CM | POA: Diagnosis not present

## 2018-10-02 LAB — POCT GLYCOSYLATED HEMOGLOBIN (HGB A1C)
Est. average glucose Bld gHb Est-mCnc: 146
Hemoglobin A1C: 6.7 % — AB (ref 4.0–5.6)

## 2018-10-02 NOTE — Patient Instructions (Signed)
.   Please review the attached list of medications and notify my office if there are any errors.   . Please bring all of your medications to every appointment so we can make sure that our medication list is the same as yours.   . We will have flu vaccines available after Labor Day. Please go to your pharmacy or call the office in early September to schedule you flu shot.   

## 2018-10-02 NOTE — Progress Notes (Signed)
Patient: David Baldwin Male    DOB: 08/12/1970   48 y.o.   MRN: [id number] Visit Date: 10/02/2018  Today's Provider: Lelon Huh, MD   Chief Complaint  Patient presents with   Diabetes   Hypertension   Subjective:     HPI  Diabetes Mellitus Type II, Follow-up:   Lab Results  Component Value Date   HGBA1C 6.8 (A) 04/03/2018   HGBA1C 6.7 (H) 09/28/2017   HGBA1C 6.4 04/18/2017    Last seen for diabetes 6 months ago.  Management since then includes no changes. He reports good compliance with treatment. He is not having side effects.  Current symptoms include none and have been stable. Home blood sugar records: blood sugars are not checked at home  Episodes of hypoglycemia? no   Current insulin regiment: Is not on insulin Most Recent Eye Exam: UTD Weight trend: decreasing steadily Prior visit with dietician: No Current exercise: walking Current diet habits: well balanced  Pertinent Labs:    Component Value Date/Time   CHOL 179 09/28/2017 0802   CHOL 228 04/04/2014   TRIG 261 (H) 09/28/2017 0802   TRIG 550 04/04/2014   HDL 33 (L) 09/28/2017 0802   LDLCALC 94 09/28/2017 0802   LDLCALC 90 12/14/2016 0806   CREATININE 0.87 09/28/2017 0802   CREATININE 0.81 04/04/2014    Wt Readings from Last 3 Encounters:  10/02/18 224 lb (101.6 kg)  04/03/18 232 lb (105.2 kg)  09/21/17 230 lb (104.3 kg)    ------------------------------------------------------------------------  Hypertension, follow-up:  BP Readings from Last 3 Encounters:  10/02/18 112/76  04/03/18 130/88  09/21/17 110/70    He was last seen for hypertension 6 months ago.  BP at that visit was 130/88. Management since that visit includes no changes. He reports good compliance with treatment. He is not having side effects.  He is exercising. He is adherent to low salt diet.   Outside blood pressures are not being checked. He is experiencing none.  Patient denies chest pain, chest  pressure/discomfort, claudication, dyspnea, exertional chest pressure/discomfort, fatigue, irregular heart beat, lower extremity edema, near-syncope, orthopnea, palpitations, paroxysmal nocturnal dyspnea, syncope and tachypnea.   Cardiovascular risk factors include diabetes mellitus, hypertension and male gender.  Use of agents associated with hypertension: none.     Weight trend: decreasing steadily Wt Readings from Last 3 Encounters:  10/02/18 224 lb (101.6 kg)  04/03/18 232 lb (105.2 kg)  09/21/17 230 lb (104.3 kg)    Current diet: well balanced  ------------------------------------------------------------------------  Allergies  Allergen Reactions   Zolpidem Tartrate Other (See Comments)     Current Outpatient Medications:    dapagliflozin propanediol (FARXIGA) 10 MG TABS tablet, Take 10 mg by mouth daily., Disp: 30 tablet, Rfl: 11   glipiZIDE (GLUCOTROL) 5 MG tablet, Take 1 tablet (5 mg total) by mouth daily before breakfast., Disp: 90 tablet, Rfl: 4   glucose blood test strip, 1 strip by Other route daily., Disp: , Rfl:    lisinopril-hydrochlorothiazide (ZESTORETIC) 10-12.5 MG tablet, Take 1 tablet by mouth daily., Disp: 90 tablet, Rfl: 4   LORazepam (ATIVAN) 1 MG tablet, Take 1 tablet (1 mg total) by mouth every 8 (eight) hours., Disp: 30 tablet, Rfl: 3   metFORMIN (GLUCOPHAGE-XR) 500 MG 24 hr tablet, Take 2 tablets (1,000 mg total) by mouth 2 (two) times daily., Disp: 120 tablet, Rfl: 6   Naproxen Sodium 220 MG CAPS, Take 1 capsule by mouth daily as needed., Disp: , Rfl:  pravastatin (PRAVACHOL) 80 MG tablet, Take 1 tablet (80 mg total) by mouth daily., Disp: 90 tablet, Rfl: 4  Review of Systems  Constitutional: Negative for appetite change, chills and fever.  Respiratory: Negative for chest tightness, shortness of breath and wheezing.   Cardiovascular: Negative for chest pain and palpitations.  Gastrointestinal: Negative for abdominal pain, nausea and vomiting.     Social History   Tobacco Use   Smoking status: Never Smoker   Smokeless tobacco: Never Used  Substance Use Topics   Alcohol use: No    Alcohol/week: 0.0 standard drinks      Objective:   BP 112/76 (BP Location: Right Arm, Patient Position: Sitting, Cuff Size: Large)    Pulse 95    Temp 99.1 F (37.3 C) (Oral)    Resp 18    Wt 224 lb (101.6 kg)    SpO2 95% Comment: room air   BMI 34.06 kg/m  Vitals:   10/02/18 0815  BP: 112/76  Pulse: 95  Resp: 18  Temp: 99.1 F (37.3 C)  TempSrc: Oral  SpO2: 95%  Weight: 224 lb (101.6 kg)     Physical Exam   General Appearance:    Alert, cooperative, no distress  Eyes:    PERRL, conjunctiva/corneas clear, EOM's intact       Lungs:     Clear to auscultation bilaterally, respirations unlabored  Heart:    Normal heart rate. Normal rhythm. No murmurs, rubs, or gallops.   Neurologic:   Awake, alert, oriented x 3. No apparent focal neurological           defect.        Results for orders placed or performed in visit on 10/02/18  POCT HgB A1C  Result Value Ref Range   Hemoglobin A1C 6.7 (A) 4.0 - 5.6 %   HbA1c POC (<> result, manual entry)     HbA1c, POC (prediabetic range)     HbA1c, POC (controlled diabetic range)     Est. average glucose Bld gHb Est-mCnc 146        Assessment & Plan    1. Type 2 diabetes mellitus with other specified complication, without long-term current use of insulin (HCC) Well controlled on current medications. Continue current medications.   - Comprehensive metabolic panel - Lipid panel - CBC  2. Essential hypertension Well controlled.  Continue current medications.    3. Hyperlipemia, mixed He is tolerating pravastatin well with no adverse effects.    4. Colon cancer screening  - Cologuard  5. Need for hepatitis C screening test  - Hepatitis C antibody   Follow up diabetes 6 months.     Mila Merryonald Norton Bivins, MD  [facility]

## 2018-10-03 LAB — LIPID PANEL
Chol/HDL Ratio: 6.1 ratio — ABNORMAL HIGH (ref 0.0–5.0)
Cholesterol, Total: 194 mg/dL (ref 100–199)
HDL: 32 mg/dL — ABNORMAL LOW (ref >39)
Triglycerides: 427 mg/dL — ABNORMAL HIGH (ref 0–149)

## 2018-10-03 LAB — COMPREHENSIVE METABOLIC PANEL
ALT: 30 IU/L (ref 0–44)
AST: 28 IU/L (ref 0–40)
Albumin/Globulin Ratio: 1.9 (ref 1.2–2.2)
Albumin: 4.8 g/dL (ref 4.0–5.0)
Alkaline Phosphatase: 53 IU/L (ref 39–117)
BUN/Creatinine Ratio: 14 (ref 9–20)
BUN: 10 mg/dL (ref 6–24)
Bilirubin Total: 0.7 mg/dL (ref 0.0–1.2)
CO2: 20 mmol/L (ref 20–29)
Calcium: 10 mg/dL (ref 8.7–10.2)
Chloride: 100 mmol/L (ref 96–106)
Creatinine, Ser: 0.71 mg/dL — ABNORMAL LOW (ref 0.76–1.27)
GFR calc Af Amer: 126 mL/min/{1.73_m2} (ref >59)
GFR calc non Af Amer: 109 mL/min/{1.73_m2} (ref >59)
Globulin, Total: 2.5 g/dL (ref 1.5–4.5)
Glucose: 85 mg/dL (ref 65–99)
Potassium: 4.3 mmol/L (ref 3.5–5.2)
Sodium: 140 mmol/L (ref 134–144)
Total Protein: 7.3 g/dL (ref 6.0–8.5)

## 2018-10-03 LAB — HEPATITIS C ANTIBODY: Hep C Virus Ab: <0.1 s/co ratio (ref 0.0–0.9)

## 2018-10-03 LAB — CBC
Hematocrit: 52.8 % — ABNORMAL HIGH (ref 37.5–51.0)
Hemoglobin: 18.3 g/dL — ABNORMAL HIGH (ref 13.0–17.7)
MCH: 31.1 pg (ref 26.6–33.0)
MCHC: 34.7 g/dL (ref 31.5–35.7)
MCV: 90 fL (ref 79–97)
Platelets: 307 10*3/uL (ref 150–450)
RBC: 5.88 x10E6/uL — ABNORMAL HIGH (ref 4.14–5.80)
RDW: 13.1 % (ref 11.6–15.4)
WBC: 9.3 10*3/uL (ref 3.4–10.8)

## 2018-10-05 ENCOUNTER — Encounter: Payer: Self-pay | Admitting: Family Medicine

## 2018-10-05 DIAGNOSIS — D582 Other hemoglobinopathies: Secondary | ICD-10-CM | POA: Insufficient documentation

## 2018-10-06 ENCOUNTER — Telehealth: Payer: Self-pay

## 2018-10-06 MED ORDER — ROSUVASTATIN CALCIUM 40 MG PO TABS: 40.0000 mg | ORAL_TABLET | Freq: Every day | ORAL | 3 refills | Status: DC

## 2018-10-06 NOTE — Telephone Encounter (Signed)
Pt advised.  RX sent to [facility] [address]   Thanks,   -Chrissy Ealey  

## 2018-10-06 NOTE — Telephone Encounter (Signed)
-----   Message from Birdie Sons, MD sent at 10/03/2018  1:04 PM EDT ----- ALSO, please add ferritin level to labs. His hemoglobin is high.

## 2018-10-06 NOTE — Telephone Encounter (Signed)
Notes recorded by Birdie Sons, MD on 10/03/2018 at 1:03 PM EDT  Triglycerides are much too high at 427 and HDL is at 32, should be over 40. Rosuvastatin (Crestor) works better than pravastatin for this. He needs to change from pravastatin to rosuvastatin 40mg  once a day, #30, rf x 3. And recheck lipids at follow up in January. Call if any problems filling or taking rosuvastatin.   Iron levels are OK. Please advise need to change pravastatin to rosuvastatin as discussed below.

## 2018-10-10 ENCOUNTER — Telehealth: Payer: Self-pay | Admitting: Family Medicine

## 2018-10-10 MED ORDER — LORAZEPAM 1 MG PO TABS: 1.0000 mg | ORAL_TABLET | Freq: Three times a day (TID) | ORAL | 3 refills | Status: DC

## 2018-10-10 NOTE — Telephone Encounter (Signed)
Pt contacted office for refill request on the following medications:  LORazepam (ATIVAN) 1 MG tablet   [facility] [address] Last Rx: 06/13/2018 LOV: 10/02/2018 Please advise. Thanks TNP

## 2018-10-12 LAB — SPECIMEN STATUS REPORT

## 2018-10-12 LAB — FERRITIN: Ferritin: 330 ng/mL (ref 30–400)

## 2018-12-28 ENCOUNTER — Other Ambulatory Visit: Payer: Self-pay | Admitting: Family Medicine

## 2018-12-28 NOTE — Telephone Encounter (Signed)
Pt needs refill  Farxiga 10 mg  [facility] [address]  [place]

## 2018-12-29 MED ORDER — FARXIGA 10 MG PO TABS: 10.0000 mg | ORAL_TABLET | Freq: Every day | ORAL | 11 refills | Status: DC

## 2018-12-29 NOTE — Telephone Encounter (Signed)
Please review. Thanks!  

## 2019-01-29 ENCOUNTER — Other Ambulatory Visit: Payer: Self-pay | Admitting: Family Medicine

## 2019-01-29 NOTE — Telephone Encounter (Signed)
Medication Refill - Medication: rosuvastatin (CRESTOR) 40 MG tablet  Has the patient contacted their pharmacy? yes (Agent: If no, request that the patient contact the pharmacy for the refill.) (Agent: If yes, when and what did the pharmacy advise?)  Preferred Pharmacy (with phone number or street name):  [facility] ([address]), [place] [phone] (Phone) [phone] (Fax)   Agent: Please be advised that RX refills may take up to 3 business days. We ask that you follow-up with your pharmacy.

## 2019-01-30 MED ORDER — ROSUVASTATIN CALCIUM 40 MG PO TABS: 40.0000 mg | ORAL_TABLET | Freq: Every day | ORAL | 3 refills | Status: DC

## 2019-01-30 NOTE — Telephone Encounter (Signed)
Requested Prescriptions  Pending Prescriptions Disp Refills  . rosuvastatin (CRESTOR) 40 MG tablet 30 tablet 3    Sig: Take 1 tablet (40 mg total) by mouth daily.     Cardiovascular:  Antilipid - Statins Failed - 01/29/2019  5:26 PM      Failed - HDL in normal range and within 360 days    HDL  Date Value Ref Range Status  10/02/2018 32 (L) >39 mg/dL Final         Failed - Triglycerides in normal range and within 360 days    Triglycerides  Date Value Ref Range Status  10/02/2018 427 (H) 0 - 149 mg/dL Final  04/04/2014 550  Final         Passed - Total Cholesterol in normal range and within 360 days    Cholesterol, Total  Date Value Ref Range Status  10/02/2018 194 100 - 199 mg/dL Final  04/04/2014 228  Final         Passed - LDL in normal range and within 360 days    LDL Cholesterol (Calc)  Date Value Ref Range Status  12/14/2016 90 mg/dL (calc) Final    Comment:    Reference range: <100 . Desirable range <100 mg/dL for primary prevention;   <70 mg/dL for patients with CHD or diabetic patients  with > or = 2 CHD risk factors. Marland Kitchen LDL-C is now calculated using the Martin-Hopkins  calculation, which is a validated novel method providing  better accuracy than the Friedewald equation in the  estimation of LDL-C.  Cresenciano Genre et al. Annamaria Helling. 5188;416(60): 2061-2068  (http://education.[employer].com/faq/FAQ164)    LDL Calculated  Date Value Ref Range Status  10/02/2018 Comment 0 - 99 mg/dL Final    Comment:    Triglyceride result indicated is too high for an accurate LDL cholesterol estimation.          Passed - Patient is not pregnant      Passed - Valid encounter within last 12 months    Recent Outpatient Visits          4 months ago Type 2 diabetes mellitus with other specified complication, without long-term current use of insulin ([place])   [facility] Birdie Sons, MD   10 months ago Type 2 diabetes mellitus without complication, without  long-term current use of insulin ([place])   [facility] Birdie Sons, MD   1 year ago Type 2 diabetes mellitus without complication, without long-term current use of insulin [facility])   [facility] Birdie Sons, MD   1 year ago Type 2 diabetes mellitus without complication, unspecified whether long term insulin use [facility])   [facility] Birdie Sons, MD   2 years ago Diabetes mellitus without complication [facility])   [facility] Birdie Sons, MD      Future Appointments            In 2 months Fisher, Kirstie Peri, MD [facility] [place]

## 2019-02-09 ENCOUNTER — Other Ambulatory Visit: Payer: Self-pay | Admitting: Family Medicine

## 2019-02-09 MED ORDER — LORAZEPAM 1 MG PO TABS: 1.0000 mg | ORAL_TABLET | Freq: Three times a day (TID) | ORAL | 3 refills | Status: DC

## 2019-02-09 NOTE — Telephone Encounter (Signed)
LORazepam (ATIVAN) 1 MG tablet      Patient is requesting a refill.    Pharmacy:  [facility] [address]), [place] [phone] (Phone) [phone] (Fax)

## 2019-04-03 NOTE — Progress Notes (Signed)
Patient: David Baldwin Male    DOB: [birth year]   49 y.o.   MRN: [id number] Visit Date: 04/04/2019  Today's Provider: Lelon Huh, MD   Chief Complaint  Patient presents with  . Diabetes  . Hypertension  . Hyperlipidemia   Subjective:     HPI  Diabetes Mellitus Type II, Follow-up:   Lab Results  Component Value Date   HGBA1C 6.7 (A) 10/02/2018   HGBA1C 6.8 (A) 04/03/2018   HGBA1C 6.7 (H) 09/28/2017    Last seen for diabetes 6 months ago.  Management since then includes no changes. He reports excellent compliance with treatment. He is not having side effects.  Current symptoms include none that he relates to his diabetes.  He does however, complain of athletes feet . Home blood sugar records:have not been being checked  Episodes of hypoglycemia? no   Current insulin regiment: Is not on insulin Most Recent Eye Exam: June per patient Weight trend: decreasing steadily Current exercise: walking Current diet habits: diabetic  Pertinent Labs:    Component Value Date/Time   CHOL 194 10/02/2018 0838   CHOL 228 04/04/2014 0000   TRIG 427 (H) 10/02/2018 0838   TRIG 550 04/04/2014 0000   HDL 32 (L) 10/02/2018 0838   LDLCALC Comment 10/02/2018 0838   LDLCALC 90 12/14/2016 0806   CREATININE 0.71 (L) 10/02/2018 0838   CREATININE 0.81 04/04/2014 0000    Wt Readings from Last 3 Encounters:  04/04/19 222 lb (100.7 kg)  10/02/18 224 lb (101.6 kg)  04/03/18 232 lb (105.2 kg)    ------------------------------------------------------------------------  Lipid/Cholesterol, Follow-up:   Last seen for this 6 months ago.  Management changes since that visit include changing pravastatin to rosuvastatin. . Last Lipid Panel:    Component Value Date/Time   CHOL 194 10/02/2018 0838   CHOL 228 04/04/2014 0000   TRIG 427 (H) 10/02/2018 0838   TRIG 550 04/04/2014 0000   HDL 32 (L) 10/02/2018 0838   CHOLHDL 6.1 (H) 10/02/2018 0838   CHOLHDL 5.0 (H) 12/14/2016 0806   LDLCALC Comment 10/02/2018 0838   LDLCALC 90 12/14/2016 0806    Risk factors for vascular disease include diabetes mellitus  He reports excellent compliance with treatment. He is not having side effects.  Current symptoms include none and have been unchanged. Weight trend: decreasing steadily Prior visit with dietician: no Current diet: diabetic Current exercise: walking  Wt Readings from Last 3 Encounters:  04/04/19 222 lb (100.7 kg)  10/02/18 224 lb (101.6 kg)  04/03/18 232 lb (105.2 kg)    -------------------------------------------------------------------  Hypertension, follow-up:  BP Readings from Last 3 Encounters:  04/04/19 128/80  10/02/18 112/76  04/03/18 130/88    He was last seen for hypertension 6 months ago.  BP at that visit was 112/76. Management since that visit includes no changes. He reports excellent compliance with treatment. He is not having side effects.  He is exercising. He is adherent to low salt diet.   Outside blood pressures are "normal" per patient but does not have readings. He is experiencing none.  Patient denies Cardiac symptoms  Use of agents associated with hypertension:Lisinopril HCT     Wt Readings from Last 3 Encounters:  04/04/19 222 lb (100.7 kg)  10/02/18 224 lb (101.6 kg)  04/03/18 232 lb (105.2 kg)    ------------------------------------------------------------------------  He also states he has been having trouble with athletes for the last few months being vigilant about keeping feet dry and using OTC antifungals  which improves feet, but can't get to clear up completely.   Allergies  Allergen Reactions  . Zolpidem Tartrate Other (See Comments)     Current Outpatient Medications:  .  dapagliflozin propanediol (FARXIGA) 10 MG TABS tablet, Take 10 mg by mouth daily., Disp: 30 tablet, Rfl: 11 .  glipiZIDE (GLUCOTROL) 5 MG tablet, Take 1 tablet (5 mg total) by mouth daily before breakfast., Disp: 90 tablet, Rfl:  4 .  glucose blood test strip, 1 strip by Other route daily., Disp: , Rfl:  .  lisinopril-hydrochlorothiazide (ZESTORETIC) 10-12.5 MG tablet, Take 1 tablet by mouth daily., Disp: 90 tablet, Rfl: 4 .  LORazepam (ATIVAN) 1 MG tablet, Take 1 tablet (1 mg total) by mouth every 8 (eight) hours., Disp: 30 tablet, Rfl: 3 .  metFORMIN (GLUCOPHAGE-XR) 500 MG 24 hr tablet, Take 2 tablets (1,000 mg total) by mouth 2 (two) times daily., Disp: 120 tablet, Rfl: 6 .  Naproxen Sodium 220 MG CAPS, Take 1 capsule by mouth daily as needed., Disp: , Rfl:  .  rosuvastatin (CRESTOR) 40 MG tablet, Take 1 tablet (40 mg total) by mouth daily., Disp: 30 tablet, Rfl: 3 .  pravastatin (PRAVACHOL) 80 MG tablet, Take 1 tablet (80 mg total) by mouth daily. (Patient not taking: Reported on 04/04/2019), Disp: 90 tablet, Rfl: 4  Review of Systems  Constitutional: Negative for appetite change, chills and fever.  Respiratory: Negative for cough, chest tightness, shortness of breath and wheezing.   Cardiovascular: Negative for chest pain and palpitations.  Gastrointestinal: Negative for abdominal pain, nausea and vomiting.  Endocrine: Negative for polydipsia and polyuria.  Genitourinary: Negative for frequency.    Social History   Tobacco Use  . Smoking status: Never Smoker  . Smokeless tobacco: Never Used  Substance Use Topics  . Alcohol use: No    Alcohol/week: 0.0 standard drinks      Objective:   BP 128/80 (BP Location: Left Arm, Patient Position: Sitting, Cuff Size: Normal)   Pulse 76   Temp (!) 97.1 F (36.2 C) (Skin)   Wt 222 lb (100.7 kg)   SpO2 97%   BMI 33.75 kg/m  Vitals:   04/04/19 0807  BP: 128/80  Pulse: 76  Temp: (!) 97.1 F (36.2 C)  TempSrc: Skin  SpO2: 97%  Weight: 222 lb (100.7 kg)  Body mass index is 33.75 kg/m.   Physical Exam   General: Appearance:    Overweight male in no acute distress  Eyes:    PERRL, conjunctiva/corneas clear, EOM's intact       Lungs:     Clear to  auscultation bilaterally, respirations unlabored  Heart:    Normal heart rate. Normal rhythm. No murmurs, rubs, or gallops.   MS:   All extremities are intact.   Neurologic:   Awake, alert, oriented x 3. No apparent focal neurological           defect.        Results for orders placed or performed in visit on 04/04/19  POCT HgB A1C  Result Value Ref Range   Hemoglobin A1C 6.5 (A) 4.0 - 5.6 %   HbA1c POC (<> result, manual entry)     HbA1c, POC (prediabetic range)     HbA1c, POC (controlled diabetic range)         Assessment & Plan    1. Type 2 diabetes mellitus with other specified complication, without long-term current use of insulin (HCC) Very well controlled. Continue current medications.    2.  Essential hypertension Well controlled.  Continue current medications.    3. Tinea pedis of both feet Failed weeks of OTC - terbinafine (LAMISIL) 250 MG tablet; Take 1 tablets (250 mg total) by mouth daily for 14 days.  Dispense: 14 tablet; Refill: 0   Patient was reminded of importance of CRC screening and states he does have Cologuard kit at home.   Follow up 6 months.     Lelon Huh, MD  [facility]

## 2019-04-04 ENCOUNTER — Ambulatory Visit (INDEPENDENT_AMBULATORY_CARE_PROVIDER_SITE_OTHER): Payer: BC Managed Care – PPO | Admitting: Family Medicine

## 2019-04-04 ENCOUNTER — Other Ambulatory Visit: Payer: Self-pay

## 2019-04-04 VITALS — BP 128/80 | HR 76 | Temp 97.1°F | Wt 222.0 lb

## 2019-04-04 DIAGNOSIS — B353 Tinea pedis: Secondary | ICD-10-CM

## 2019-04-04 DIAGNOSIS — I10 Essential (primary) hypertension: Secondary | ICD-10-CM

## 2019-04-04 DIAGNOSIS — E1169 Type 2 diabetes mellitus with other specified complication: Secondary | ICD-10-CM

## 2019-04-04 LAB — POCT GLYCOSYLATED HEMOGLOBIN (HGB A1C): Hemoglobin A1C: 6.5 % — AB (ref 4.0–5.6)

## 2019-04-04 MED ORDER — TERBINAFINE HCL 250 MG PO TABS: 250.0000 mg | ORAL_TABLET | Freq: Every day | ORAL | 0 refills | Status: AC

## 2019-04-04 MED ORDER — TERBINAFINE HCL 250 MG PO TABS: 500.0000 mg | ORAL_TABLET | Freq: Every day | ORAL | 0 refills | Status: DC

## 2019-05-02 ENCOUNTER — Other Ambulatory Visit: Payer: Self-pay | Admitting: Family Medicine

## 2019-05-02 NOTE — Telephone Encounter (Signed)
Requested medication (s) are due for refill today: yes  Requested medication (s) are on the active medication list: yes  Last refill:  06/26/2018  Future visit scheduled: yes  Notes to clinic:  Endocrinology: Diabetes - Biguanides failed  Requested Prescriptions  Pending Prescriptions Disp Refills   metFORMIN (GLUCOPHAGE-XR) 500 MG 24 hr tablet [Pharmacy Med Name: metFORMIN HCl ER 500 MG Oral Tablet Extended Release 24 Hour] 360 tablet 0    Sig: Take 2 tablets by mouth twice daily      Endocrinology:  Diabetes - Biguanides Failed - 05/02/2019 12:03 PM      Failed - Cr in normal range and within 360 days    Creat  Date Value Ref Range Status  04/04/2014 0.81  Final   Creatinine, Ser  Date Value Ref Range Status  10/02/2018 0.71 (L) 0.76 - 1.27 mg/dL Final   Creatinine, POC  Date Value Ref Range Status  09/25/2015 n/a mg/dL Final          Passed - HBA1C is between 0 and 7.9 and within 180 days    Hemoglobin A1C  Date Value Ref Range Status  04/04/2019 6.5 (A) 4.0 - 5.6 % Final  01/17/2014 7.3+  Final   Hgb A1c MFr Bld  Date Value Ref Range Status  09/28/2017 6.7 (H) 4.8 - 5.6 % Final    Comment:             Prediabetes: 5.7 - 6.4          Diabetes: >6.4          Glycemic control for adults with diabetes: <7.0           Passed - eGFR in normal range and within 360 days    GFR calc Af Amer  Date Value Ref Range Status  10/02/2018 126 >59 mL/min/1.73 Final   GFR calc non Af Amer  Date Value Ref Range Status  10/02/2018 109 >59 mL/min/1.73 Final          Passed - Valid encounter within last 6 months    Recent Outpatient Visits           4 weeks ago Type 2 diabetes mellitus with other specified complication, without long-term current use of insulin ([place])   [facility] Birdie Sons, MD   7 months ago Type 2 diabetes mellitus with other specified complication, without long-term current use of insulin ([place])   [facility]  Birdie Sons, MD   1 year ago Type 2 diabetes mellitus without complication, without long-term current use of insulin ([place])   [facility] Birdie Sons, MD   1 year ago Type 2 diabetes mellitus without complication, without long-term current use of insulin [facility])   [facility] Birdie Sons, MD   2 years ago Type 2 diabetes mellitus without complication, unspecified whether long term insulin use [facility])   [facility] Birdie Sons, MD       Future Appointments             In 5 months Fisher, Kirstie Peri, MD [facility] [place]

## 2019-05-03 ENCOUNTER — Other Ambulatory Visit: Payer: Self-pay | Admitting: Family Medicine

## 2019-05-03 MED ORDER — ROSUVASTATIN CALCIUM 40 MG PO TABS: 40.0000 mg | ORAL_TABLET | Freq: Every day | ORAL | 3 refills | Status: DC

## 2019-05-03 NOTE — Telephone Encounter (Signed)
[facility] faxed refill request for the following medications:  rosuvastatin (CRESTOR) 40 MG tablet    Please advise.  

## 2019-06-04 ENCOUNTER — Other Ambulatory Visit: Payer: Self-pay | Admitting: Family Medicine

## 2019-06-04 NOTE — Telephone Encounter (Signed)
Medication Refill - Medication: LORazepam (ATIVAN) 1 MG tablet  Pt requesting 90 day supply like his other medications  Has the patient contacted their pharmacy? Yes.   (Agent: If no, request that the patient contact the pharmacy for the refill.) (Agent: If yes, when and what did the pharmacy advise?)  Preferred Pharmacy (with phone number or street name):  [facility] [address] [place]), [place] - [address] Phone:  [phone]  Fax:  [phone]       Agent: Please be advised that RX refills may take up to 3 business days. We ask that you follow-up with your pharmacy.

## 2019-06-04 NOTE — Telephone Encounter (Signed)
Requested medication (s) are due for refill today: yes  Requested medication (s) are on the active medication list: yes  Last refill:  02/09/2019  Future visit scheduled: yes  Notes to clinic:  not delegated    Requested Prescriptions  Pending Prescriptions Disp Refills   LORazepam (ATIVAN) 1 MG tablet 30 tablet 3    Sig: Take 1 tablet (1 mg total) by mouth every 8 (eight) hours.      Not Delegated - Psychiatry:  Anxiolytics/Hypnotics Failed - 06/04/2019  9:06 AM      Failed - This refill cannot be delegated      Failed - Urine Drug Screen completed in last 360 days.      Passed - Valid encounter within last 6 months    Recent Outpatient Visits           2 months ago Type 2 diabetes mellitus with other specified complication, without long-term current use of insulin (HCC)   [facility] Malva Limes, MD   8 months ago Type 2 diabetes mellitus with other specified complication, without long-term current use of insulin [facility])   [facility] Malva Limes, MD   1 year ago Type 2 diabetes mellitus without complication, without long-term current use of insulin [facility])   [facility] Malva Limes, MD   1 year ago Type 2 diabetes mellitus without complication, without long-term current use of insulin [facility]   [facility] Malva Limes, MD   2 years ago Type 2 diabetes mellitus without complication, unspecified whether long term insulin use [facility])   [facility] Malva Limes, MD       Future Appointments             In 4 months Fisher, Demetrios Isaacs, MD [facility]

## 2019-06-05 MED ORDER — LORAZEPAM 1 MG PO TABS: 1.0000 mg | ORAL_TABLET | Freq: Three times a day (TID) | ORAL | 5 refills | Status: DC

## 2019-08-07 ENCOUNTER — Other Ambulatory Visit: Payer: Self-pay | Admitting: Family Medicine

## 2019-08-07 MED ORDER — METFORMIN HCL ER 500 MG PO TB24: 1000.0000 mg | ORAL_TABLET | Freq: Two times a day (BID) | ORAL | 0 refills | Status: DC

## 2019-08-07 NOTE — Telephone Encounter (Signed)
PT need a refill  metFORMIN (GLUCOPHAGE-XR) 500 MG 24 hr tablet [[id number]]  [facility] [address] ([address]), [place] - [address]  [address]) [place] [address]  Phone: [phone] Fax: [phone]

## 2019-08-07 NOTE — Telephone Encounter (Signed)
Requested Prescriptions  Pending Prescriptions Disp Refills   metFORMIN (GLUCOPHAGE-XR) 500 MG 24 hr tablet 360 tablet 0    Sig: Take 2 tablets (1,000 mg total) by mouth 2 (two) times daily.     Endocrinology:  Diabetes - Biguanides Failed - 08/07/2019 10:52 AM      Failed - Cr in normal range and within 360 days    Creat  Date Value Ref Range Status  04/04/2014 0.81  Final   Creatinine, Ser  Date Value Ref Range Status  10/02/2018 0.71 (L) 0.76 - 1.27 mg/dL Final   Creatinine, POC  Date Value Ref Range Status  09/25/2015 n/a mg/dL Final         Passed - HBA1C is between 0 and 7.9 and within 180 days    Hemoglobin A1C  Date Value Ref Range Status  04/04/2019 6.5 (A) 4.0 - 5.6 % Final  01/17/2014 7.3+  Final   Hgb A1c MFr Bld  Date Value Ref Range Status  09/28/2017 6.7 (H) 4.8 - 5.6 % Final    Comment:             Prediabetes: 5.7 - 6.4          Diabetes: >6.4          Glycemic control for adults with diabetes: <7.0          Passed - eGFR in normal range and within 360 days    GFR calc Af Amer  Date Value Ref Range Status  10/02/2018 126 >59 mL/min/1.73 Final   GFR calc non Af Amer  Date Value Ref Range Status  10/02/2018 109 >59 mL/min/1.73 Final         Passed - Valid encounter within last 6 months    Recent Outpatient Visits          4 months ago Type 2 diabetes mellitus with other specified complication, without long-term current use of insulin ([place])   [facility] Birdie Sons, MD   10 months ago Type 2 diabetes mellitus with other specified complication, without long-term current use of insulin ([place])   [facility] Birdie Sons, MD   1 year ago Type 2 diabetes mellitus without complication, without long-term current use of insulin ([place])   [facility] Birdie Sons, MD   1 year ago Type 2 diabetes mellitus without complication, without long-term current use of insulin [facility])   [facility]  Birdie Sons, MD   2 years ago Type 2 diabetes mellitus without complication, unspecified whether long term insulin use [facility])   [facility] Birdie Sons, MD      Future Appointments            In 1 month Fisher, Kirstie Peri, MD [facility]

## 2019-09-24 ENCOUNTER — Other Ambulatory Visit: Payer: Self-pay | Admitting: Family Medicine

## 2019-09-24 MED ORDER — ROSUVASTATIN CALCIUM 40 MG PO TABS: 40.0000 mg | ORAL_TABLET | Freq: Every day | ORAL | 0 refills | Status: DC

## 2019-09-24 NOTE — Telephone Encounter (Signed)
rosuvastatin (CRESTOR) 40 MG tablet     Patient requesting refill.    Pharmacy:  [facility] [address]), [place] - [address] Phone:  [phone]  Fax:  [phone]

## 2019-10-02 ENCOUNTER — Ambulatory Visit: Payer: Self-pay | Admitting: Family Medicine

## 2019-10-12 NOTE — Progress Notes (Signed)
Established patient visit   Patient: David Baldwin   DOB: [birth year]   49 y.o. Male  MRN: [id number] Visit Date: 10/15/2019  Today's healthcare provider: Mila Merry, MD   Chief Complaint  Patient presents with  . Diabetes  . Hyperlipidemia  . Hypertension   [employer] as a scribe for Mila Merry, MD.,have documented all relevant documentation on the behalf of Mila Merry, MD,as directed by  Mila Merry, MD while in the presence of Mila Merry, MD.  Subjective    HPI  Diabetes Mellitus Type II, Follow-up  Lab Results  Component Value Date   HGBA1C 6.5 (A) 04/04/2019   HGBA1C 6.7 (A) 10/02/2018   HGBA1C 6.8 (A) 04/03/2018   Wt Readings from Last 3 Encounters:  10/15/19 221 lb 9.6 oz (100.5 kg)  04/04/19 222 lb (100.7 kg)  10/02/18 224 lb (101.6 kg)   Last seen for diabetes 6 months ago.  Management since then includes continue same medications. He reports good compliance with treatment. He is not having side effects.  Symptoms: No fatigue No foot ulcerations  No appetite changes No nausea  No paresthesia of the feet  No polydipsia  No polyuria No visual disturbances   No vomiting     Home blood sugar records: are not being checked at home  Episodes of hypoglycemia? No    Current insulin regiment: none Most Recent Eye Exam: not UTD Current exercise: walking Current diet habits: in general, a "healthy" diet    Pertinent Labs: Lab Results  Component Value Date   CHOL 194 10/02/2018   HDL 32 (L) 10/02/2018   LDLCALC Comment 10/02/2018   TRIG 427 (H) 10/02/2018   CHOLHDL 6.1 (H) 10/02/2018   Lab Results  Component Value Date   NA 140 10/02/2018   K 4.3 10/02/2018   CREATININE 0.71 (L) 10/02/2018   GFRNONAA 109 10/02/2018   GFRAA 126 10/02/2018   GLUCOSE 85 10/02/2018     ---------------------------------------------------------------------------------------------------  Hypertension, follow-up  BP Readings from Last 3  Encounters:  10/15/19 138/89  04/04/19 128/80  10/02/18 112/76   Wt Readings from Last 3 Encounters:  10/15/19 221 lb 9.6 oz (100.5 kg)  04/04/19 222 lb (100.7 kg)  10/02/18 224 lb (101.6 kg)     He was last seen for hypertension 6 months ago.  BP at that visit was 128/80. Management since that visit includes continue same medications.  He reports good compliance with treatment. He is not having side effects.  He is following a Regular diet. He is exercising. He does not smoke.  Use of agents associated with hypertension: none.   Outside blood pressures are not being checked at home. Symptoms: No chest pain No chest pressure  No palpitations No syncope  No dyspnea No orthopnea  No paroxysmal nocturnal dyspnea No lower extremity edema   Pertinent labs: Lab Results  Component Value Date   CHOL 194 10/02/2018   HDL 32 (L) 10/02/2018   LDLCALC Comment 10/02/2018   TRIG 427 (H) 10/02/2018   CHOLHDL 6.1 (H) 10/02/2018   Lab Results  Component Value Date   NA 140 10/02/2018   K 4.3 10/02/2018   CREATININE 0.71 (L) 10/02/2018   GFRNONAA 109 10/02/2018   GFRAA 126 10/02/2018   GLUCOSE 85 10/02/2018     The 10-year ASCVD risk score Denman George DC Jr., et al., 2013) is: 14.3%   ---------------------------------------------------------------------------------------------------  Lipid/Cholesterol, Follow-up  Last lipid panel Other pertinent labs  Lab Results  Component Value  Date   CHOL 194 10/02/2018   HDL 32 (L) 10/02/2018   LDLCALC Comment 10/02/2018   TRIG 427 (H) 10/02/2018   CHOLHDL 6.1 (H) 10/02/2018   Lab Results  Component Value Date   ALT 30 10/02/2018   AST 28 10/02/2018   PLT 307 10/02/2018     He was last seen for this 1 year ago.  Management since that visit includes changing pravastatin to rosuvastatin.  He reports good compliance with treatment. He is not having side effects.   Symptoms: No chest pain No chest pressure/discomfort  No dyspnea No  lower extremity edema  No numbness or tingling of extremity No orthopnea  No palpitations No paroxysmal nocturnal dyspnea  No speech difficulty No syncope   Current diet: in general, a "healthy" diet   Current exercise: walking  The 10-year ASCVD risk score Denman George DC Jr., et al., 2013) is: 14.3%  ---------------------------------------------------------------------------------------------------     Medications: Outpatient Medications Prior to Visit  Medication Sig  . dapagliflozin propanediol (FARXIGA) 10 MG TABS tablet Take 10 mg by mouth daily.  Marland Kitchen glipiZIDE (GLUCOTROL) 5 MG tablet Take 1 tablet (5 mg total) by mouth daily before breakfast.  . glucose blood test strip 1 strip by Other route daily.  Marland Kitchen lisinopril-hydrochlorothiazide (ZESTORETIC) 10-12.5 MG tablet Take 1 tablet by mouth daily.  Marland Kitchen LORazepam (ATIVAN) 1 MG tablet Take 1 tablet (1 mg total) by mouth every 8 (eight) hours.  . metFORMIN (GLUCOPHAGE-XR) 500 MG 24 hr tablet Take 2 tablets (1,000 mg total) by mouth 2 (two) times daily.  . Naproxen Sodium 220 MG CAPS Take 1 capsule by mouth daily as needed.  . rosuvastatin (CRESTOR) 40 MG tablet Take 1 tablet (40 mg total) by mouth daily.  . [DISCONTINUED] pravastatin (PRAVACHOL) 80 MG tablet Take 1 tablet (80 mg total) by mouth daily. (Patient not taking: Reported on 04/04/2019)   No facility-administered medications prior to visit.    Review of Systems  Constitutional: Negative.   Respiratory: Negative.   Cardiovascular: Negative.   Musculoskeletal: Negative.       Objective    BP 138/89 (BP Location: Right Arm, Patient Position: Sitting, Cuff Size: Large)   Pulse 69   Temp 98.1 F (36.7 C) (Oral)   Ht 5\' 8"  (1.727 m)   Wt 221 lb 9.6 oz (100.5 kg)   BMI 33.69 kg/m    Physical Exam   General: Appearance:    Obese male in no acute distress  Eyes:    PERRL, conjunctiva/corneas clear, EOM's intact       Lungs:     Clear to auscultation bilaterally, respirations  unlabored  Heart:    Normal heart rate. Normal rhythm. No murmurs, rubs, or gallops.   MS:   All extremities are intact.   Neurologic:   Awake, alert, oriented x 3. No apparent focal neurological           defect.         Assessment & Plan     1. Type 2 diabetes mellitus with other specified complication, without long-term current use of insulin (HCC) Doing well on current medication regiment - HgB A1c - Comprehensive Metabolic Panel (CMET)  Reminded he is due for diabetic eye exam.   2. Essential hypertension Well controlled.  Continue current medications.    3. Hyperlipemia, mixed He is tolerating rosuvastatin well with no adverse effects.   - Lipid panel  4. Encounter for screening for HIV  - HIV Antibody (routine testing w  rflx)  5. Prostate cancer screening  - PSA  6. Colon cancer screening  - Cologuard   Follow up pending lab results.   No follow-ups on file.      The entirety of the information documented in the History of Present Illness, Review of Systems and Physical Exam were personally obtained by me. Portions of this information were initially documented by the CMA and reviewed by me for thoroughness and accuracy.      Mila Merry, MD  [facility] [phone] (phone) [phone] (fax)  [facility]

## 2019-10-15 ENCOUNTER — Ambulatory Visit: Payer: BC Managed Care – PPO | Admitting: Family Medicine

## 2019-10-15 ENCOUNTER — Encounter: Payer: Self-pay | Admitting: Family Medicine

## 2019-10-15 ENCOUNTER — Other Ambulatory Visit: Payer: Self-pay

## 2019-10-15 VITALS — BP 138/89 | HR 69 | Temp 98.1°F | Ht 68.0 in | Wt 221.6 lb

## 2019-10-15 DIAGNOSIS — Z114 Encounter for screening for human immunodeficiency virus [HIV]: Secondary | ICD-10-CM

## 2019-10-15 DIAGNOSIS — Z125 Encounter for screening for malignant neoplasm of prostate: Secondary | ICD-10-CM

## 2019-10-15 DIAGNOSIS — I10 Essential (primary) hypertension: Secondary | ICD-10-CM

## 2019-10-15 DIAGNOSIS — Z1211 Encounter for screening for malignant neoplasm of colon: Secondary | ICD-10-CM

## 2019-10-15 DIAGNOSIS — E782 Mixed hyperlipidemia: Secondary | ICD-10-CM

## 2019-10-15 DIAGNOSIS — E1169 Type 2 diabetes mellitus with other specified complication: Secondary | ICD-10-CM | POA: Diagnosis not present

## 2019-10-15 NOTE — Patient Instructions (Addendum)
Please contact your eyecare professional to schedule a routine eye exam   Be sure to complete and send in you Cologuard test for colon polyp and colon cancer screening

## 2019-10-16 ENCOUNTER — Other Ambulatory Visit: Payer: Self-pay | Admitting: Family Medicine

## 2019-10-16 DIAGNOSIS — E1169 Type 2 diabetes mellitus with other specified complication: Secondary | ICD-10-CM

## 2019-10-16 DIAGNOSIS — I10 Essential (primary) hypertension: Secondary | ICD-10-CM

## 2019-10-16 DIAGNOSIS — E782 Mixed hyperlipidemia: Secondary | ICD-10-CM

## 2019-10-16 LAB — COMPREHENSIVE METABOLIC PANEL
ALT: 18 IU/L (ref 0–44)
AST: 16 IU/L (ref 0–40)
Albumin/Globulin Ratio: 2.1 (ref 1.2–2.2)
Albumin: 4.9 g/dL (ref 3.8–4.9)
Alkaline Phosphatase: 52 IU/L (ref 48–121)
BUN/Creatinine Ratio: 16 (ref 9–20)
BUN: 14 mg/dL (ref 6–24)
Bilirubin Total: 0.5 mg/dL (ref 0.0–1.2)
CO2: 20 mmol/L (ref 20–29)
Calcium: 10.5 mg/dL — ABNORMAL HIGH (ref 8.7–10.2)
Chloride: 104 mmol/L (ref 96–106)
Creatinine, Ser: 0.88 mg/dL (ref 0.76–1.27)
GFR calc Af Amer: 115 mL/min/{1.73_m2} (ref >59)
GFR calc non Af Amer: 99 mL/min/{1.73_m2} (ref >59)
Globulin, Total: 2.3 g/dL (ref 1.5–4.5)
Glucose: 87 mg/dL (ref 65–99)
Potassium: 4.2 mmol/L (ref 3.5–5.2)
Sodium: 142 mmol/L (ref 134–144)
Total Protein: 7.2 g/dL (ref 6.0–8.5)

## 2019-10-16 LAB — HEMOGLOBIN A1C
Est. average glucose Bld gHb Est-mCnc: 148 mg/dL
Hgb A1c MFr Bld: 6.8 % — ABNORMAL HIGH (ref 4.8–5.6)

## 2019-10-16 LAB — LIPID PANEL
Chol/HDL Ratio: 4.9 ratio (ref 0.0–5.0)
Cholesterol, Total: 176 mg/dL (ref 100–199)
HDL: 36 mg/dL — ABNORMAL LOW (ref >39)
LDL Chol Calc (NIH): 84 mg/dL (ref 0–99)
Triglycerides: 340 mg/dL — ABNORMAL HIGH (ref 0–149)
VLDL Cholesterol Cal: 56 mg/dL — ABNORMAL HIGH (ref 5–40)

## 2019-10-16 LAB — HIV ANTIBODY (ROUTINE TESTING W REFLEX): HIV Screen 4th Generation wRfx: NONREACTIVE

## 2019-10-16 LAB — PSA: Prostate Specific Ag, Serum: 0.6 ng/mL (ref 0.0–4.0)

## 2019-10-16 MED ORDER — GLIPIZIDE 5 MG PO TABS: 5.0000 mg | ORAL_TABLET | Freq: Every day | ORAL | 4 refills | Status: DC

## 2019-10-16 MED ORDER — ROSUVASTATIN CALCIUM 40 MG PO TABS: 40.0000 mg | ORAL_TABLET | Freq: Every day | ORAL | 4 refills | Status: DC

## 2019-10-16 MED ORDER — METFORMIN HCL ER 500 MG PO TB24: 1000.0000 mg | ORAL_TABLET | Freq: Two times a day (BID) | ORAL | 4 refills | Status: DC

## 2019-10-16 MED ORDER — LISINOPRIL-HYDROCHLOROTHIAZIDE 10-12.5 MG PO TABS: 1.0000 | ORAL_TABLET | Freq: Every day | ORAL | 4 refills | Status: DC

## 2019-11-05 ENCOUNTER — Telehealth: Payer: Self-pay | Admitting: Family Medicine

## 2019-11-05 DIAGNOSIS — E1169 Type 2 diabetes mellitus with other specified complication: Secondary | ICD-10-CM

## 2019-11-05 NOTE — Telephone Encounter (Signed)
Medication: glipiZIDE (GLUCOTROL) 5 MG tablet [[id number]]   Has the patient contacted their pharmacy? YES  (Agent: If no, request that the patient contact the pharmacy for the refill.) (Agent: If yes, when and what did the pharmacy advise?)  Preferred Pharmacy (with phone number or street name): [facility] [address] ([address]), [place] - [address]) [place] [address] Phone: [phone] Fax: [phone] Hours: Not open 24 hours    Agent: Please be advised that RX refills may take up to 3 business days. We ask that you follow-up with your pharmacy.

## 2019-11-05 NOTE — Telephone Encounter (Signed)
[facility] called and spoke to Severy, [facility] about the refill requested. She says they have it on file and will refill it for the patient.

## 2019-12-04 ENCOUNTER — Other Ambulatory Visit: Payer: Self-pay | Admitting: Family Medicine

## 2019-12-04 NOTE — Telephone Encounter (Signed)
Requested medications are due for refill today?  Yes - this medication refill cannot be delegated.    Requested medications are on active medication list? Yes  Last Refill:   06/05/2019  # 30 with 5 refills   Future visit scheduled?  Yes in 3 months.    Notes to Clinic:  This medication refill cannot be delegated.

## 2019-12-04 NOTE — Telephone Encounter (Signed)
Medication Refill - Medication:LORazepam (ATIVAN) 1 MG tablet [[id number]]       Preferred Pharmacy (with phone number or street name):  [facility] [address] ([address]), [place] - [address]  [address]) [place] [address]  Phone: [phone] Fax: [phone]  Hours: Not open 24 hours     Agent: Please be advised that RX refills may take up to 3 business days. We ask that you follow-up with your pharmacy.

## 2019-12-05 MED ORDER — LORAZEPAM 1 MG PO TABS
1.0000 mg | ORAL_TABLET | Freq: Three times a day (TID) | ORAL | 5 refills | Status: DC
Start: 2019-12-05 — End: 2020-06-03

## 2019-12-24 ENCOUNTER — Other Ambulatory Visit: Payer: Self-pay | Admitting: Family Medicine

## 2019-12-24 MED ORDER — DAPAGLIFLOZIN PROPANEDIOL 10 MG PO TABS
10.0000 mg | ORAL_TABLET | Freq: Every day | ORAL | 0 refills | Status: DC
Start: 2019-12-24 — End: 2020-03-17

## 2019-12-24 NOTE — Telephone Encounter (Signed)
Medication Refill - Medication: farxiga  Has the patient contacted their pharmacy? No. (Agent: If no, request that the patient contact the pharmacy for the refill.) (Agent: If yes, when and what did the pharmacy advise?)  Preferred Pharmacy (with phone number or street name): [id number] - [place] ([address]), [place] - [address]  Agent: Please be advised that RX refills may take up to 3 business days. We ask that you follow-up with your pharmacy.

## 2020-03-17 ENCOUNTER — Other Ambulatory Visit: Payer: Self-pay

## 2020-03-17 ENCOUNTER — Encounter: Payer: Self-pay | Admitting: Family Medicine

## 2020-03-17 ENCOUNTER — Ambulatory Visit (INDEPENDENT_AMBULATORY_CARE_PROVIDER_SITE_OTHER): Payer: BC Managed Care – PPO | Admitting: Family Medicine

## 2020-03-17 VITALS — BP 123/80 | HR 66 | Temp 98.5°F | Resp 16 | Wt 219.4 lb

## 2020-03-17 DIAGNOSIS — I10 Essential (primary) hypertension: Secondary | ICD-10-CM | POA: Diagnosis not present

## 2020-03-17 DIAGNOSIS — Z0000 Encounter for general adult medical examination without abnormal findings: Secondary | ICD-10-CM

## 2020-03-17 DIAGNOSIS — E1169 Type 2 diabetes mellitus with other specified complication: Secondary | ICD-10-CM | POA: Diagnosis not present

## 2020-03-17 DIAGNOSIS — E782 Mixed hyperlipidemia: Secondary | ICD-10-CM

## 2020-03-17 MED ORDER — DAPAGLIFLOZIN PROPANEDIOL 10 MG PO TABS: 10.0000 mg | ORAL_TABLET | Freq: Every day | ORAL | 2 refills | Status: DC

## 2020-03-17 NOTE — Patient Instructions (Signed)

## 2020-03-17 NOTE — Progress Notes (Signed)
Complete physical exam   Patient: David Baldwin   DOB: [birth year]   50 y.o. Male  MRN: [id number] Visit Date: 03/17/2020  Today's healthcare provider: Lelon Huh, MD   Chief Complaint  Patient presents with  . Annual Exam  . Hypertension  . Diabetes  . Hyperlipidemia   Subjective     HPI  David Baldwin is a 50 y.o. male who presents today for a complete physical exam.  He reports consuming a general diet. The patient has a physically strenuous job, but has no regular exercise apart from work.  He generally feels well. He reports sleeping fairly well. He does not have additional problems to discuss today. He completed Cologuard last week and results are pending. He had his flu shot at work. He is scheduled to see Dr. Gwynn Burly for eye exam next week.   He is doing well with [place] for diabetes.  Lab Results  Component Value Date   HGBA1C 6.8 (H) 10/15/2019   HGBA1C 6.5 (A) 04/04/2019   HGBA1C 6.7 (A) 10/02/2018    Checks sugars occasionally which he thinks were pretty good. Is tolerating statin well.  Lipid Panel     Component Value Date/Time   CHOL 176 10/15/2019 0856   CHOL 228 04/04/2014 0000   TRIG 340 (H) 10/15/2019 0856   TRIG 550 04/04/2014 0000   HDL 36 (L) 10/15/2019 0856   CHOLHDL 4.9 10/15/2019 0856   CHOLHDL 5.0 (H) 12/14/2016 0806   LDLCALC 84 10/15/2019 0856   LDLCALC 90 12/14/2016 0806   LABVLDL 56 (H) 10/15/2019 0856     History reviewed. No pertinent past medical history. Past Surgical History:  Procedure Laterality Date  . None     Social History   Socioeconomic History  . Marital status: Married    Spouse name: Not on file  . Number of children: 0  . Years of education: HS Grad  . Highest education level: Not on file  Occupational History  . Occupation: Full-Time  Tobacco Use  . Smoking status: Never Smoker  . Smokeless tobacco: Never Used  Substance and Sexual Activity  . Alcohol use: No    Alcohol/week: 0.0 standard drinks   . Drug use: No  . Sexual activity: Not on file  Other Topics Concern  . Not on file  Social History Narrative  . Not on file   Social Determinants of Health   Financial Resource Strain: Not on file  Food Insecurity: Not on file  Transportation Needs: Not on file  Physical Activity: Not on file  Stress: Not on file  Social Connections: Not on file  Intimate Partner Violence: Not on file   Family Status  Relation Name Status  . Mother  Alive  . Father  Deceased at age 28's       Stroke and MI  . Sister  Alive  . Sister  Alive  . Neg Hx  (Not Specified)   Family History  Problem Relation Age of Onset  . Stroke Father   . Heart attack Father   . Hypertension Neg Hx    Allergies  Allergen Reactions  . Zolpidem Tartrate Other (See Comments)    Patient Care Team: Birdie Sons, MD as PCP - General (Family Medicine) [facility])   Medications: Outpatient Medications Prior to Visit  Medication Sig  . dapagliflozin propanediol (FARXIGA) 10 MG TABS tablet Take 1 tablet (10 mg total) by mouth daily.  Marland Kitchen glipiZIDE (GLUCOTROL) 5  MG tablet Take 1 tablet (5 mg total) by mouth daily before breakfast.  . glucose blood test strip 1 strip by Other route daily.  . lisinopril-hydrochlorothiazide (ZESTORETIC) 10-12.5 MG tablet Take 1 tablet by mouth daily.  . LORazepam (ATIVAN) 1 MG tablet Take 1 tablet (1 mg total) by mouth every 8 (eight) hours.  . metFORMIN (GLUCOPHAGE-XR) 500 MG 24 hr tablet Take 2 tablets (1,000 mg total) by mouth 2 (two) times daily.  . Naproxen Sodium 220 MG CAPS Take 1 capsule by mouth daily as needed.  . rosuvastatin (CRESTOR) 40 MG tablet Take 1 tablet (40 mg total) by mouth daily.   No facility-administered medications prior to visit.    Review of Systems  All other systems reviewed and are negative.     Objective    BP 123/80   Pulse 66   Temp 98.5 F (36.9 C) (Oral)   Resp 16   Wt 219 lb 6.4 oz (99.5 kg)   SpO2 100%    BMI 33.36 kg/m    Physical Exam    General Appearance:    Obese male. Alert, cooperative, in no acute distress, appears stated age  Head:    Normocephalic, without obvious abnormality, atraumatic  Eyes:    PERRL, conjunctiva/corneas clear, EOM's intact, fundi    benign, both eyes       Ears:    Normal TM's and external ear canals, both ears  Nose:   Nares normal, septum midline, mucosa normal, no drainage   or sinus tenderness  Throat:   Lips, mucosa, and tongue normal; teeth and gums normal  Neck:   Supple, symmetrical, trachea midline, no adenopathy;       thyroid:  No enlargement/tenderness/nodules; no carotid   bruit or JVD  Back:     Symmetric, no curvature, ROM normal, no CVA tenderness  Lungs:     Clear to auscultation bilaterally, respirations unlabored  Chest wall:    No tenderness or deformity  Heart:    Normal heart rate. Normal rhythm. No murmurs, rubs, or gallops.  S1 and S2 normal  Abdomen:     Soft, non-tender, bowel sounds active all four quadrants,    no masses, no organomegaly  Genitalia:    deferred  Rectal:    deferred  Extremities:   All extremities are intact. No cyanosis or edema  Pulses:   2+ and symmetric all extremities  Skin:   Skin color, texture, turgor normal, no rashes or lesions  Lymph nodes:   Cervical, supraclavicular, and axillary nodes normal  Neurologic:   CNII-XII intact. Normal strength, sensation and reflexes      throughout     Last depression screening scores PHQ 2/9 Scores 03/17/2020 10/15/2019 10/02/2018  PHQ - 2 Score 0 0 0  PHQ- 9 Score 0 - -   Last fall risk screening Fall Risk  03/17/2020  Falls in the past year? 0  Number falls in past yr: 0  Injury with Fall? 0   Last Audit-C alcohol use screening Alcohol Use Disorder Test (AUDIT) 03/17/2020  1. How often do you have a drink containing alcohol? 2  2. How many drinks containing alcohol do you have on a typical day when you are drinking? 0  3. How often do you have six or  more drinks on one occasion? 0  AUDIT-C Score 2   A score of 3 or more in women, and 4 or more in men indicates increased risk for alcohol abuse, EXCEPT if all   of the points are from question 1   No results found for any visits on 03/17/20.  Assessment & Plan    Routine Health Maintenance and Physical Exam  Exercise Activities and Dietary recommendations Goals   None     Immunization History  Administered Date(s) Administered  . Influenza,inj,Quad PF,6+ Mos 01/06/2014  . Influenza-Unspecified 12/07/2014, 12/06/2016, 12/15/2017, 12/07/2018  . MMR 08/03/2017  . Tdap 03/08/2012    Health Maintenance  Topic Date Due  . PNEUMOCOCCAL POLYSACCHARIDE VACCINE AGE 2-64 HIGH RISK  Never done  . FOOT EXAM  Never done  . COVID-19 Vaccine (1) Never done  . COLONOSCOPY (Pts 45-49yrs Insurance coverage will need to be confirmed)  Never done  . OPHTHALMOLOGY EXAM  07/13/2018  . INFLUENZA VACCINE  10/07/2019  . HEMOGLOBIN A1C  04/16/2020  . TETANUS/TDAP  03/08/2022  . Hepatitis C Screening  Completed  . HIV Screening  Completed    Discussed health benefits of physical activity, and encouraged him to engage in regular exercise appropriate for his age and condition.  1. Annual physical exam Doing very well. He declined flu vaccine. Cologuard is in process. He had two Covid vaccine.  - EKG 12-Lead  2. Primary hypertension Well controlled. Continue current medications.   - EKG 12-Lead  3. Type 2 diabetes mellitus with other specified complication, without long-term current use of insulin (HCC) Doing well, is scheduled for diabetic eye exam.  - dapagliflozin propanediol (FARXIGA) 10 MG TABS tablet; Take 1 tablet (10 mg total) by mouth daily.  Dispense: 90 tablet; Refill: 2 - Hemoglobin A1c - EKG 12-Lead  4. Hyperlipemia, mixed He is tolerating rosuvastatin well with no adverse effects.    - Comprehensive metabolic panel - CBC with Differential/Platelet - Lipid panel - EKG 12-Lead         The entirety of the information documented in the History of Present Illness, Review of Systems and Physical Exam were personally obtained by me. Portions of this information were initially documented by the CMA and reviewed by me for thoroughness and accuracy.      Donald Fisher, MD  [facility] [phone] (phone) [phone] (fax)  [facility] 

## 2020-03-18 LAB — CBC WITH DIFFERENTIAL/PLATELET
Basophils Absolute: 0.1 10*3/uL (ref 0.0–0.2)
Basos: 1 %
EOS (ABSOLUTE): 0.2 10*3/uL (ref 0.0–0.4)
Eos: 2 %
Hematocrit: 48.5 % (ref 37.5–51.0)
Hemoglobin: 17.2 g/dL (ref 13.0–17.7)
Immature Grans (Abs): 0 10*3/uL (ref 0.0–0.1)
Immature Granulocytes: 0 %
Lymphocytes Absolute: 2.6 10*3/uL (ref 0.7–3.1)
Lymphs: 31 %
MCH: 31.2 pg (ref 26.6–33.0)
MCHC: 35.5 g/dL (ref 31.5–35.7)
MCV: 88 fL (ref 79–97)
Monocytes Absolute: 0.4 10*3/uL (ref 0.1–0.9)
Monocytes: 5 %
Neutrophils Absolute: 5.1 10*3/uL (ref 1.4–7.0)
Neutrophils: 61 %
Platelets: 270 10*3/uL (ref 150–450)
RBC: 5.51 x10E6/uL (ref 4.14–5.80)
RDW: 12.1 % (ref 11.6–15.4)
WBC: 8.4 10*3/uL (ref 3.4–10.8)

## 2020-03-18 LAB — LIPID PANEL
Chol/HDL Ratio: 4.3 ratio (ref 0.0–5.0)
Cholesterol, Total: 138 mg/dL (ref 100–199)
HDL: 32 mg/dL — ABNORMAL LOW (ref >39)
LDL Chol Calc (NIH): 64 mg/dL (ref 0–99)
Triglycerides: 258 mg/dL — ABNORMAL HIGH (ref 0–149)
VLDL Cholesterol Cal: 42 mg/dL — ABNORMAL HIGH (ref 5–40)

## 2020-03-18 LAB — COMPREHENSIVE METABOLIC PANEL
ALT: 13 IU/L (ref 0–44)
AST: 14 IU/L (ref 0–40)
Albumin/Globulin Ratio: 2 (ref 1.2–2.2)
Albumin: 4.7 g/dL (ref 3.8–4.9)
Alkaline Phosphatase: 54 IU/L (ref 44–121)
BUN/Creatinine Ratio: 14 (ref 9–20)
BUN: 14 mg/dL (ref 6–24)
Bilirubin Total: 0.5 mg/dL (ref 0.0–1.2)
CO2: 18 mmol/L — ABNORMAL LOW (ref 20–29)
Calcium: 10.1 mg/dL (ref 8.7–10.2)
Chloride: 106 mmol/L (ref 96–106)
Creatinine, Ser: 1 mg/dL (ref 0.76–1.27)
GFR calc Af Amer: 100 mL/min/{1.73_m2} (ref >59)
GFR calc non Af Amer: 87 mL/min/{1.73_m2} (ref >59)
Globulin, Total: 2.4 g/dL (ref 1.5–4.5)
Glucose: 85 mg/dL (ref 65–99)
Potassium: 4.1 mmol/L (ref 3.5–5.2)
Sodium: 142 mmol/L (ref 134–144)
Total Protein: 7.1 g/dL (ref 6.0–8.5)

## 2020-03-18 LAB — HEMOGLOBIN A1C
Est. average glucose Bld gHb Est-mCnc: 151 mg/dL
Hgb A1c MFr Bld: 6.9 % — ABNORMAL HIGH (ref 4.8–5.6)

## 2020-03-21 LAB — EXTERNAL GENERIC LAB PROCEDURE: COLOGUARD: NEGATIVE

## 2020-03-21 LAB — COLOGUARD
COLOGUARD: NEGATIVE
Cologuard: NEGATIVE

## 2020-04-08 LAB — HM DIABETES EYE EXAM

## 2020-06-03 ENCOUNTER — Other Ambulatory Visit: Payer: Self-pay | Admitting: Family Medicine

## 2020-06-03 MED ORDER — LORAZEPAM 1 MG PO TABS
1.0000 mg | ORAL_TABLET | Freq: Three times a day (TID) | ORAL | 5 refills | Status: DC
Start: 2020-06-03 — End: 2020-11-24

## 2020-06-03 NOTE — Telephone Encounter (Signed)
Requested medication (s) are due for refill today: yes  Requested medication (s) are on the active medication list: yes  Future visit scheduled: yes  Notes to clinic:  this refill cannot be delegated    Requested Prescriptions  Pending Prescriptions Disp Refills   LORazepam (ATIVAN) 1 MG tablet 30 tablet 5    Sig: Take 1 tablet (1 mg total) by mouth every 8 (eight) hours.      There is no refill protocol information for this order

## 2020-06-03 NOTE — Telephone Encounter (Signed)
Medication Refill - Medication: Lorazapam 1mg   Has the patient contacted their pharmacy? No.pt states there is no refills on the bottle  (Agent: If no, request that the patient contact the pharmacy for the refill.) (Agent: If yes, when and what did the pharmacy advise?)  Preferred Pharmacy (with phone number or street name): [facility] [address]  Agent: Please be advised that RX refills may take up to 3 business days. We ask that you follow-up with your pharmacy.

## 2020-07-18 ENCOUNTER — Ambulatory Visit: Payer: BC Managed Care – PPO | Admitting: Family Medicine

## 2020-07-18 ENCOUNTER — Other Ambulatory Visit: Payer: Self-pay

## 2020-07-18 VITALS — BP 130/88 | HR 75 | Ht 68.0 in | Wt 218.0 lb

## 2020-07-18 DIAGNOSIS — E1169 Type 2 diabetes mellitus with other specified complication: Secondary | ICD-10-CM

## 2020-07-18 DIAGNOSIS — M2550 Pain in unspecified joint: Secondary | ICD-10-CM

## 2020-07-18 MED ORDER — MELOXICAM 15 MG PO TABS: 15.0000 mg | ORAL_TABLET | Freq: Every day | ORAL | 2 refills | Status: DC

## 2020-07-18 NOTE — Progress Notes (Signed)
Established patient visit   Patient: David Baldwin   DOB: 1970-10-19   50 y.o. Male  MRN: [id number] Visit Date: 07/18/2020  Today's healthcare provider: Lelon Huh, MD   No chief complaint on file.  Subjective    HPI  Diabetes Mellitus Type II, Follow-up  Lab Results  Component Value Date   HGBA1C 6.9 (H) 03/17/2020   HGBA1C 6.8 (H) 10/15/2019   HGBA1C 6.5 (A) 04/04/2019   Wt Readings from Last 3 Encounters:  03/17/20 219 lb 6.4 oz (99.5 kg)  10/15/19 221 lb 9.6 oz (100.5 kg)  04/04/19 222 lb (100.7 kg)   Last seen for diabetes 4 months ago.  Management since then includes dapagliflozin propanediol (FARXIGA) 10 MG TABS tablet; Take 1 tablet (10 mg total) by mouth daily.  He reports excellent compliance with treatment. He is not having side effects.  Symptoms: No fatigue No foot ulcerations  No appetite changes No nausea  No paresthesia of the feet  No polydipsia  No polyuria No visual disturbances   No vomiting     Home blood sugar records: n/a  Episodes of hypoglycemia? No    Current insulin regiment: n/a Most Recent Eye Exam: 07/12/17 Current exercise: walking Current diet habits: well balanced  Pertinent Labs: Lab Results  Component Value Date   CHOL 138 03/17/2020   HDL 32 (L) 03/17/2020   LDLCALC 64 03/17/2020   TRIG 258 (H) 03/17/2020   CHOLHDL 4.3 03/17/2020   Lab Results  Component Value Date   NA 142 03/17/2020   K 4.1 03/17/2020   CREATININE 1.00 03/17/2020   GFRNONAA 87 03/17/2020   GFRAA 100 03/17/2020   GLUCOSE 85 03/17/2020     --------------------------------------------------------------------------------------------------- He reports long history pains in joints of fingers, hands, toes and neck. OTC medications no longer helping and is limiting use of hands.      Medications: Outpatient Medications Prior to Visit  Medication Sig  . dapagliflozin propanediol (FARXIGA) 10 MG TABS tablet Take 1 tablet (10 mg total) by  mouth daily.  Marland Kitchen glipiZIDE (GLUCOTROL) 5 MG tablet Take 1 tablet (5 mg total) by mouth daily before breakfast.  . glucose blood test strip 1 strip by Other route daily.  Marland Kitchen lisinopril-hydrochlorothiazide (ZESTORETIC) 10-12.5 MG tablet Take 1 tablet by mouth daily.  Marland Kitchen LORazepam (ATIVAN) 1 MG tablet Take 1 tablet (1 mg total) by mouth every 8 (eight) hours.  . metFORMIN (GLUCOPHAGE-XR) 500 MG 24 hr tablet Take 2 tablets (1,000 mg total) by mouth 2 (two) times daily.  . Naproxen Sodium 220 MG CAPS Take 1 capsule by mouth daily as needed.  . rosuvastatin (CRESTOR) 40 MG tablet Take 1 tablet (40 mg total) by mouth daily.   No facility-administered medications prior to visit.    Review of Systems     Objective    BP 130/88 (BP Location: Right Arm, Patient Position: Sitting, Cuff Size: Small)   Pulse 75   Ht _0  (1.727 m)   Wt 218 lb (98.9 kg)   SpO2 99%   BMI 33.15 kg/m     Physical Exam  General appearance: Mildly obese male, cooperative and in no acute distress Head: Normocephalic, without obvious abnormality, atraumatic Respiratory: Respirations even and unlabored, normal respiratory rate Extremities: All extremities are intact.  Skin: Skin color, texture, turgor normal. No rashes seen  Psych: Appropriate mood and affect. Neurologic: Mental status: Alert, oriented to person, place, and time, thought content appropriate.     Assessment &  Plan     1. Type 2 diabetes mellitus with other specified complication, without long-term current use of insulin (HCC) Doing well on current medications.  - Hemoglobin A1c  2. Arthralgia, unspecified joint  Mostly in hands - Sed Rate (ESR) - Rheumatoid Factor Try meloxicam (MOBIC) 15 MG tablet; Take 1 tablet (15 mg total) by mouth daily.  Dispense: 30 tablet; Refill: 2            Lelon Huh, MD  [facility] [phone] (phone) [phone] (fax)  [place]

## 2020-07-19 LAB — RHEUMATOID FACTOR: Rheumatoid fact SerPl-aCnc: <10 IU/mL (ref <14.0)

## 2020-07-19 LAB — HEMOGLOBIN A1C
Est. average glucose Bld gHb Est-mCnc: 154 mg/dL
Hgb A1c MFr Bld: 7 % — ABNORMAL HIGH (ref 4.8–5.6)

## 2020-07-19 LAB — SEDIMENTATION RATE: Sed Rate: 10 mm/hr (ref 0–30)

## 2020-10-20 ENCOUNTER — Other Ambulatory Visit: Payer: Self-pay | Admitting: Family Medicine

## 2020-10-20 DIAGNOSIS — E1169 Type 2 diabetes mellitus with other specified complication: Secondary | ICD-10-CM

## 2020-11-24 ENCOUNTER — Other Ambulatory Visit: Payer: Self-pay | Admitting: Family Medicine

## 2020-11-24 DIAGNOSIS — G4700 Insomnia, unspecified: Secondary | ICD-10-CM

## 2020-11-24 NOTE — Telephone Encounter (Signed)
Medication Refill - Medication:  LORazepam (ATIVAN) 1 MG tablet 30 tablet 5 06/03/2020    Sig - Route: Take 1 tablet (1 mg total) by mouth every 8 (eight) hours. - Oral   Sent to pharmacy as: LORazepam (ATIVAN) 1 MG tablet   E-Prescribing Status: Receipt confirmed by pharmacy (06/03/2020 11:18 AM EDT)   Pt requesting refill and also asking if med can be 90 day like his others.   Has the patient contacted their pharmacy? No. (Agent: If no, request that the patient contact the pharmacy for the refill.) Pt states Dr Sherrie Mustache says to call in to him (Agent: If yes, when and what did the pharmacy advise?)  Preferred Pharmacy (with phone number or street name):  [facility] [address] ([address]), [place] - [address]  [address] [place]) [place] [address]  Phone: [phone] Fax: [phone]   Has the patient been seen for an appointment in the last year OR does the patient have an upcoming appointment? Yes.   Also made appt today for 12/26/20  Agent: Please be advised that RX refills may take up to 3 business days. We ask that you follow-up with your pharmacy.

## 2020-11-24 NOTE — Telephone Encounter (Signed)
Patient is requesting a 90 day supply. Please advise whether this medication can be dispensed as a 90 day supply.

## 2020-11-25 MED ORDER — LORAZEPAM 1 MG PO TABS: 1.0000 mg | ORAL_TABLET | Freq: Three times a day (TID) | ORAL | 5 refills | Status: DC | PRN

## 2020-12-15 ENCOUNTER — Other Ambulatory Visit: Payer: Self-pay | Admitting: Family Medicine

## 2020-12-15 DIAGNOSIS — E1169 Type 2 diabetes mellitus with other specified complication: Secondary | ICD-10-CM

## 2021-01-05 ENCOUNTER — Ambulatory Visit: Payer: BC Managed Care – PPO | Admitting: Family Medicine

## 2021-01-05 ENCOUNTER — Other Ambulatory Visit: Payer: Self-pay

## 2021-01-05 VITALS — BP 125/88 | HR 71 | Temp 98.5°F | Resp 16 | Ht 68.0 in | Wt 215.0 lb

## 2021-01-05 DIAGNOSIS — E1169 Type 2 diabetes mellitus with other specified complication: Secondary | ICD-10-CM | POA: Diagnosis not present

## 2021-01-05 DIAGNOSIS — E782 Mixed hyperlipidemia: Secondary | ICD-10-CM

## 2021-01-05 DIAGNOSIS — I10 Essential (primary) hypertension: Secondary | ICD-10-CM

## 2021-01-05 LAB — POCT GLYCOSYLATED HEMOGLOBIN (HGB A1C)
Est. average glucose Bld gHb Est-mCnc: 146
Hemoglobin A1C: 6.7 % — AB (ref 4.0–5.6)

## 2021-01-05 MED ORDER — METFORMIN HCL ER 500 MG PO TB24: 1000.0000 mg | ORAL_TABLET | Freq: Two times a day (BID) | ORAL | 3 refills | Status: DC

## 2021-01-05 MED ORDER — GLIPIZIDE 5 MG PO TABS: 5.0000 mg | ORAL_TABLET | Freq: Every day | ORAL | 4 refills | Status: DC

## 2021-01-05 MED ORDER — LISINOPRIL-HYDROCHLOROTHIAZIDE 10-12.5 MG PO TABS: 1.0000 | ORAL_TABLET | Freq: Every day | ORAL | 4 refills | Status: DC

## 2021-01-05 MED ORDER — ROSUVASTATIN CALCIUM 40 MG PO TABS: 40.0000 mg | ORAL_TABLET | Freq: Every day | ORAL | 4 refills | Status: DC

## 2021-01-05 NOTE — Progress Notes (Signed)
I,April Miller,acting as a scribe for Mila Merry, MD.,have documented all relevant documentation on the behalf of Mila Merry, MD,as directed by  Mila Merry, MD while in the presence of Mila Merry, MD.   Established patient visit   Patient: David Baldwin   DOB: Jul 29, 1970   50 y.o. Male  MRN: [id number] Visit Date: 01/05/2021  Today's healthcare provider: Mila Merry, MD   Chief Complaint  Patient presents with   Diabetes   Hypertension   Subjective    HPI  Diabetes Mellitus Type II, follow-up  Lab Results  Component Value Date   HGBA1C 7.0 (H) 07/18/2020   HGBA1C 6.9 (H) 03/17/2020   HGBA1C 6.8 (H) 10/15/2019   Last seen for diabetes 5 months ago.  Management since then includes continuing the same treatment. He reports good compliance with treatment. He is not having side effects. none  Home blood sugar records: fasting range: not checking  Episodes of hypoglycemia? No none   Current insulin regiment: n/a Most Recent Eye Exam: 07/12/2017  --------------------------------------------------------------------------------------------------- Hypertension, follow-up  BP Readings from Last 3 Encounters:  01/05/21 125/88  07/18/20 130/88  03/17/20 123/80   Wt Readings from Last 3 Encounters:  01/05/21 215 lb (97.5 kg)  07/18/20 218 lb (98.9 kg)  03/17/20 219 lb 6.4 oz (99.5 kg)     He was last seen for hypertension 5 months ago.  BP at that visit was 130/88. Management since that visit includes; on lisinopril-hydrochlorothiazide. He reports good compliance with treatment. He is not having side effects. none He is not exercising. He is adherent to low salt diet.   Outside blood pressures are not checking.  He does not smoke.  Use of agents associated with hypertension: none.   --------------------------------------------------------------------------------------------------- Follow up for Arthralgia:  The patient was last seen for this 5  months ago. Changes made at last visit include; given meloxicam 15 mg qd.  He reports poor compliance with treatment. He feels that condition is Unchanged. He is not having side effects. none  ----------------------------------------------------------------------------------------- Patient stopped taking meloxicam.    Medications: Outpatient Medications Prior to Visit  Medication Sig   FARXIGA 10 MG TABS tablet Take 1 tablet by mouth once daily   glipiZIDE (GLUCOTROL) 5 MG tablet TAKE 1 TABLET BY MOUTH ONCE DAILY BEFORE BREAKFAST   glucose blood test strip 1 strip by Other route daily.   lisinopril-hydrochlorothiazide (ZESTORETIC) 10-12.5 MG tablet Take 1 tablet by mouth daily.   LORazepam (ATIVAN) 1 MG tablet Take 1 tablet (1 mg total) by mouth every 8 (eight) hours as needed.   metFORMIN (GLUCOPHAGE-XR) 500 MG 24 hr tablet Take 2 tablets (1,000 mg total) by mouth 2 (two) times daily.   rosuvastatin (CRESTOR) 40 MG tablet Take 1 tablet (40 mg total) by mouth daily.   meloxicam (MOBIC) 15 MG tablet Take 1 tablet (15 mg total) by mouth daily. (Patient not taking: Reported on 01/05/2021)   Naproxen Sodium 220 MG CAPS Take 1 capsule by mouth daily as needed. (Patient not taking: Reported on 01/05/2021)   No facility-administered medications prior to visit.    Review of Systems  Constitutional:  Negative for appetite change, chills and fever.  Respiratory:  Negative for chest tightness, shortness of breath and wheezing.   Cardiovascular:  Negative for chest pain and palpitations.  Gastrointestinal:  Negative for abdominal pain, nausea and vomiting.      Objective    BP 125/88 (BP Location: Left Arm, Patient Position: Sitting, Cuff Size: Large)  Pulse 71   Temp 98.5 F (36.9 C) (Oral)   Resp 16   Ht 5\' 8"  (1.727 m)   Wt 215 lb (97.5 kg)   SpO2 99%   BMI 32.69 kg/m  {Show previous vital signs (optional):23777}  Physical Exam   General: Appearance:    Mildly obese male in  no acute distress  Eyes:    PERRL, conjunctiva/corneas clear, EOM's intact       Lungs:     Clear to auscultation bilaterally, respirations unlabored  Heart:    Normal heart rate. Normal rhythm. No murmurs, rubs, or gallops.    MS:   All extremities are intact.    Neurologic:   Awake, alert, oriented x 3. No apparent focal neurological defect.        Results for orders placed or performed in visit on 01/05/21  POCT glycosylated hemoglobin (Hb A1C)  Result Value Ref Range   Hemoglobin A1C 6.7 (A) 4.0 - 5.6 %   Est. average glucose Bld gHb Est-mCnc 146     Assessment & Plan     1. Type 2 diabetes mellitus with other specified complication, without long-term current use of insulin (HCC) Well controlled.  Continue current medications.   - glipiZIDE (GLUCOTROL) 5 MG tablet; Take 1 tablet (5 mg total) by mouth daily before breakfast.  Dispense: 90 tablet; Refill: 4 - metFORMIN (GLUCOPHAGE-XR) 500 MG 24 hr tablet; Take 2 tablets (1,000 mg total) by mouth 2 (two) times daily.  Dispense: 360 tablet; Refill: 3  2. Hyperlipemia, mixed He is tolerating rosuvastatin well with no adverse effects.   - rosuvastatin (CRESTOR) 40 MG tablet; Take 1 tablet (40 mg total) by mouth daily.  Dispense: 90 tablet; Refill: 4  3. Primary hypertension Well controlled.  Continue current medications.   - lisinopril-hydrochlorothiazide (ZESTORETIC) 10-12.5 MG tablet; Take 1 tablet by mouth daily.  Dispense: 90 tablet; Refill: 4   Future Appointments  Date Time Provider Department Center  04/17/2021  9:00 AM 06/15/2021, Sherrie Mustache, MD [facility]         The entirety of the information documented in the History of Present Illness, Review of Systems and Physical Exam were personally obtained by me. Portions of this information were initially documented by the CMA and reviewed by me for thoroughness and accuracy.     Demetrios Isaacs, MD  [facility] [phone] (phone) [phone] (fax)  [facility]

## 2021-01-05 NOTE — Patient Instructions (Signed)
.   Please review the attached list of medications and notify my office if there are any errors.   . Please bring all of your medications to every appointment so we can make sure that our medication list is the same as yours.   

## 2021-01-06 LAB — HM DIABETES EYE EXAM

## 2021-04-16 NOTE — Progress Notes (Signed)
I,David Baldwin,acting as a scribe for David Huh, MD.,have documented all relevant documentation on the behalf of David Huh, MD,as directed by  David Huh, MD while in the presence of David Huh, MD.   Complete physical exam   Patient: David Baldwin   DOB: 1970-05-26   51 y.o. Male  MRN: [id number] Visit Date: 04/17/2021  Today's healthcare provider: Lelon Huh, MD   Chief Complaint  Patient presents with   Annual Exam   Diabetes   Hyperlipidemia   Hypertension   Subjective    David Baldwin is a 51 y.o. male who presents today for a complete physical exam.  He reports consuming a general diet. Home exercise routine includes walking. He generally feels fairly well. He reports sleeping fairly well. He does not have additional problems to discuss today.  HPI  Diabetes Mellitus Type II, Follow-up  Lab Results  Component Value Date   HGBA1C 6.7 (A) 01/05/2021   HGBA1C 7.0 (H) 07/18/2020   HGBA1C 6.9 (H) 03/17/2020   Wt Readings from Last 3 Encounters:  04/17/21 212 lb (96.2 kg)  01/05/21 215 lb (97.5 kg)  07/18/20 218 lb (98.9 kg)   Last seen for diabetes 3 months ago.  Management since then includes continue same medication. He reports good compliance with treatment. He is not having side effects.  Symptoms: No fatigue No foot ulcerations  No appetite changes No nausea  No paresthesia of the feet  No polydipsia  No polyuria No visual disturbances   No vomiting     Home blood sugar records:  blood sugars are not checked  Episodes of hypoglycemia? No    Current insulin regiment: none Most Recent Eye Exam: >1 year ago Current exercise: walking Current diet habits: well balanced  Pertinent Labs: Lab Results  Component Value Date   CHOL 138 03/17/2020   HDL 32 (L) 03/17/2020   LDLCALC 64 03/17/2020   TRIG 258 (H) 03/17/2020   CHOLHDL 4.3 03/17/2020   Lab Results  Component Value Date   NA 142 03/17/2020   K 4.1 03/17/2020    CREATININE 1.00 03/17/2020   GFRNONAA 87 03/17/2020   MICROALBUR 20 09/25/2015     ---------------------------------------------------------------------------------------------------   Lipid/Cholesterol, Follow-up  Last lipid panel Other pertinent labs  Lab Results  Component Value Date   CHOL 138 03/17/2020   HDL 32 (L) 03/17/2020   LDLCALC 64 03/17/2020   TRIG 258 (H) 03/17/2020   CHOLHDL 4.3 03/17/2020   Lab Results  Component Value Date   ALT 13 03/17/2020   AST 14 03/17/2020   PLT 270 03/17/2020     He was last seen for this 3 months ago.  Management since that visit includes continue same medication.  He reports good compliance with treatment. He is not having side effects.   Symptoms: No chest pain No chest pressure/discomfort  No dyspnea No lower extremity edema  No numbness or tingling of extremity No orthopnea  No palpitations No paroxysmal nocturnal dyspnea  No speech difficulty No syncope   Current diet: well balanced Current exercise: walking  The 10-year ASCVD risk score (Arnett DK, et al., 2019) is: 9%  ---------------------------------------------------------------------------------------------------   Hypertension, follow-up  BP Readings from Last 3 Encounters:  04/17/21 129/85  01/05/21 125/88  07/18/20 130/88   Wt Readings from Last 3 Encounters:  04/17/21 212 lb (96.2 kg)  01/05/21 215 lb (97.5 kg)  07/18/20 218 lb (98.9 kg)     He was last seen for  hypertension 3 months ago.  BP at that visit was 125/88. Management since that visit includes continue same medication.  He reports good compliance with treatment. He is not having side effects.  He is following a Regular diet. He is exercising. He does not smoke.  Use of agents associated with hypertension: none.   Outside blood pressures are not checked. Symptoms: No chest pain No chest pressure  No palpitations No syncope  No dyspnea No orthopnea  No paroxysmal nocturnal  dyspnea No lower extremity edema    The 10-year ASCVD risk score (Arnett DK, et al., 2019) is: 9%   ---------------------------------------------------------------------------------------------------   No past medical history on file. Past Surgical History:  Procedure Laterality Date   None     Social History   Socioeconomic History   Marital status: Married    Spouse name: Not on file   Number of children: 0   Years of education: HS Grad   Highest education level: Not on file  Occupational History   Occupation: Full-Time  Tobacco Use   Smoking status: Never   Smokeless tobacco: Never  Substance and Sexual Activity   Alcohol use: No    Alcohol/week: 0.0 standard drinks   Drug use: No   Sexual activity: Not on file  Other Topics Concern   Not on file  Social History Narrative   Not on file   Social Determinants of Health   Financial Resource Strain: Not on file  Food Insecurity: Not on file  Transportation Needs: Not on file  Physical Activity: Not on file  Stress: Not on file  Social Connections: Not on file  Intimate Partner Violence: Not on file   Family Status  Relation Name Status   Mother  Alive   Father  Deceased at age 33's       Stroke and MI   Sister  Alive   Sister  Alive   Neg Hx  (Not Specified)   Family History  Problem Relation Age of Onset   Stroke Father    Heart attack Father    Hypertension Neg Hx    Allergies  Allergen Reactions   Zolpidem Tartrate Other (See Comments)    Patient Care Team: Birdie Sons, MD as PCP - General (Family Medicine) Odette Fraction [facility])   Medications: Outpatient Medications Prior to Visit  Medication Sig   FARXIGA 10 MG TABS tablet Take 1 tablet by mouth once daily   glipiZIDE (GLUCOTROL) 5 MG tablet Take 1 tablet (5 mg total) by mouth daily before breakfast.   glucose blood test strip 1 strip by Other route daily.   lisinopril-hydrochlorothiazide (ZESTORETIC) 10-12.5 MG tablet Take 1  tablet by mouth daily.   LORazepam (ATIVAN) 1 MG tablet Take 1 tablet (1 mg total) by mouth every 8 (eight) hours as needed.   metFORMIN (GLUCOPHAGE-XR) 500 MG 24 hr tablet Take 2 tablets (1,000 mg total) by mouth 2 (two) times daily.   Naproxen Sodium 220 MG CAPS Take 1 capsule by mouth daily as needed.   rosuvastatin (CRESTOR) 40 MG tablet Take 1 tablet (40 mg total) by mouth daily.   No facility-administered medications prior to visit.    Review of Systems  Constitutional:  Negative for appetite change, chills, fatigue and fever.  HENT:  Negative for congestion, ear pain, hearing loss, nosebleeds and trouble swallowing.   Eyes:  Negative for pain and visual disturbance.  Respiratory:  Negative for cough, chest tightness and shortness of breath.   Cardiovascular:  Negative for chest pain, palpitations and leg swelling.  Gastrointestinal:  Negative for abdominal pain, blood in stool, constipation, diarrhea, nausea and vomiting.  Endocrine: Negative for polydipsia, polyphagia and polyuria.  Genitourinary:  Negative for dysuria and flank pain.  Musculoskeletal:  Negative for arthralgias, back pain, joint swelling, myalgias and neck stiffness.  Skin:  Negative for color change, rash and wound.  Neurological:  Negative for dizziness, tremors, seizures, speech difficulty, weakness, light-headedness and headaches.  Psychiatric/Behavioral:  Negative for behavioral problems, confusion, decreased concentration, dysphoric mood and sleep disturbance. The patient is not nervous/anxious.   All other systems reviewed and are negative.    Objective    BP 129/85 (BP Location: Right Arm, Patient Position: Sitting, Cuff Size: Large)    Pulse 72    Temp 97.8 F (36.6 C) (Oral)    Resp 14    Ht 5' 8"  (1.727 m)    Wt 212 lb (96.2 kg)    SpO2 99% Comment: room air   BMI 32.23 kg/m    Physical Exam    General Appearance:    Mildly obese male. Alert, cooperative, in no acute distress, appears stated age   Head:    Normocephalic, without obvious abnormality, atraumatic  Eyes:    PERRL, conjunctiva/corneas clear, EOM's intact, fundi    benign, both eyes       Ears:    Normal TM's and external ear canals, both ears  Neck:   Supple, symmetrical, trachea midline, no adenopathy;       thyroid:  No enlargement/tenderness/nodules; no carotid   bruit or JVD  Back:     Symmetric, no curvature, ROM normal, no CVA tenderness  Lungs:     Clear to auscultation bilaterally, respirations unlabored  Chest wall:    No tenderness or deformity  Heart:    Normal heart rate. Normal rhythm. No murmurs, rubs, or gallops.  S1 and S2 normal  Abdomen:     Soft, non-tender, bowel sounds active all four quadrants,    no masses, no organomegaly  Genitalia:    deferred  Rectal:    deferred  Extremities:   All extremities are intact. No cyanosis or edema  Pulses:   2+ and symmetric all extremities  Skin:   Skin color, texture, turgor normal, no rashes or lesions  Lymph nodes:   Cervical, supraclavicular, and axillary nodes normal  Neurologic:   CNII-XII intact. Normal strength, sensation and reflexes      throughout     Last depression screening scores PHQ 2/9 Scores 04/17/2021 01/05/2021 03/17/2020  PHQ - 2 Score 0 0 0  PHQ- 9 Score 0 0 0   Last fall risk screening Fall Risk  01/05/2021  Falls in the past year? 0  Number falls in past yr: 0  Injury with Fall? 0  Risk for fall due to : No Fall Risks  Follow up Falls evaluation completed   Last Audit-C alcohol use screening Alcohol Use Disorder Test (AUDIT) 01/05/2021  1. How often do you have a drink containing alcohol? 1  2. How many drinks containing alcohol do you have on a typical day when you are drinking? 0  3. How often do you have six or more drinks on one occasion? 0  AUDIT-C Score 1   A score of 3 or more in women, and 4 or more in men indicates increased risk for alcohol abuse, EXCEPT if all of the points are from question 1   No results  found for any  visits on 04/17/21.  Assessment & Plan    Routine Health Maintenance and Physical Exam  Exercise Activities and Dietary recommendations  Goals   None     Immunization History  Administered Date(s) Administered   Influenza,inj,Quad PF,6+ Mos 01/06/2014, 12/20/2020   Influenza-Unspecified 12/07/2014, 12/06/2016, 12/15/2017, 12/07/2018   MMR 08/03/2017   Tdap 03/08/2012    Health Maintenance  Topic Date Due   COVID-19 Vaccine (1) Never done   FOOT EXAM  Never done   COLONOSCOPY (Pts 45-67yr Insurance coverage will need to be confirmed)  Never done   Zoster Vaccines- Shingrix (1 of 2) Never done   HEMOGLOBIN A1C  07/05/2021   OPHTHALMOLOGY EXAM  01/06/2022   TETANUS/TDAP  03/08/2022   INFLUENZA VACCINE  Completed   Hepatitis C Screening  Completed   HIV Screening  Completed   HPV VACCINES  Aged Out    Discussed health benefits of physical activity, and encouraged him to engage in regular exercise appropriate for his age and condition.   2. Primary hypertension Well controlled.  Continue current medications.   - EKG 12-Lead  3. Type 2 diabetes mellitus with other specified complication, without long-term current use of insulin (HCC) Doing well current med.  - Hemoglobin A1c - Urine Microalbumin w/creat. ratio  Follow up 6 months if labs are normal.   4. Hyperlipemia, mixed He is tolerating rosuvastatin well with no adverse effects.   - CBC - Comprehensive metabolic panel - Lipid panel  5. Prostate cancer screening  - PSA Total (Reflex To Free) ([facility] only)    He declined shingles vaccine today.     The entirety of the information documented in the History of Present Illness, Review of Systems and Physical Exam were personally obtained by me. Portions of this information were initially documented by the CMA and reviewed by me for thoroughness and accuracy.     DLelon Huh MD  [phone](phone) [phone] (fax)  [place]

## 2021-04-17 ENCOUNTER — Ambulatory Visit (INDEPENDENT_AMBULATORY_CARE_PROVIDER_SITE_OTHER): Payer: BC Managed Care – PPO | Admitting: Family Medicine

## 2021-04-17 ENCOUNTER — Encounter: Payer: Self-pay | Admitting: Family Medicine

## 2021-04-17 ENCOUNTER — Other Ambulatory Visit: Payer: Self-pay

## 2021-04-17 VITALS — BP 129/85 | HR 72 | Temp 97.8°F | Resp 14 | Ht 68.0 in | Wt 212.0 lb

## 2021-04-17 DIAGNOSIS — E1169 Type 2 diabetes mellitus with other specified complication: Secondary | ICD-10-CM | POA: Diagnosis not present

## 2021-04-17 DIAGNOSIS — I10 Essential (primary) hypertension: Secondary | ICD-10-CM | POA: Diagnosis not present

## 2021-04-17 DIAGNOSIS — E782 Mixed hyperlipidemia: Secondary | ICD-10-CM | POA: Diagnosis not present

## 2021-04-17 DIAGNOSIS — Z125 Encounter for screening for malignant neoplasm of prostate: Secondary | ICD-10-CM

## 2021-04-17 DIAGNOSIS — Z0000 Encounter for general adult medical examination without abnormal findings: Secondary | ICD-10-CM | POA: Diagnosis not present

## 2021-04-17 NOTE — Patient Instructions (Signed)
.   Please review the attached list of medications and notify my office if there are any errors.   . Please bring all of your medications to every appointment so we can make sure that our medication list is the same as yours.    The CDC recommends two doses of Shingrix (the shingles vaccine) separated by 2 to 6 months for adults age 53 years and older. I recommend checking with your insurance plan regarding coverage for this vaccine.   

## 2021-04-18 LAB — CBC
Hematocrit: 46.1 % (ref 37.5–51.0)
Hemoglobin: 16 g/dL (ref 13.0–17.7)
MCH: 31.1 pg (ref 26.6–33.0)
MCHC: 34.7 g/dL (ref 31.5–35.7)
MCV: 90 fL (ref 79–97)
Platelets: 251 10*3/uL (ref 150–450)
RBC: 5.15 x10E6/uL (ref 4.14–5.80)
RDW: 13 % (ref 11.6–15.4)
WBC: 7.1 10*3/uL (ref 3.4–10.8)

## 2021-04-18 LAB — HEMOGLOBIN A1C
Est. average glucose Bld gHb Est-mCnc: 154 mg/dL
Hgb A1c MFr Bld: 7 % — ABNORMAL HIGH (ref 4.8–5.6)

## 2021-04-18 LAB — COMPREHENSIVE METABOLIC PANEL
ALT: 15 IU/L (ref 0–44)
AST: 18 IU/L (ref 0–40)
Albumin/Globulin Ratio: 2.1 (ref 1.2–2.2)
Albumin: 4.7 g/dL (ref 3.8–4.9)
Alkaline Phosphatase: 55 IU/L (ref 44–121)
BUN/Creatinine Ratio: 17 (ref 9–20)
BUN: 17 mg/dL (ref 6–24)
Bilirubin Total: 0.4 mg/dL (ref 0.0–1.2)
CO2: 20 mmol/L (ref 20–29)
Calcium: 9.8 mg/dL (ref 8.7–10.2)
Chloride: 105 mmol/L (ref 96–106)
Creatinine, Ser: 0.98 mg/dL (ref 0.76–1.27)
Globulin, Total: 2.2 g/dL (ref 1.5–4.5)
Glucose: 91 mg/dL (ref 70–99)
Potassium: 4.2 mmol/L (ref 3.5–5.2)
Sodium: 142 mmol/L (ref 134–144)
Total Protein: 6.9 g/dL (ref 6.0–8.5)
eGFR: 93 mL/min/{1.73_m2} (ref >59)

## 2021-04-18 LAB — LIPID PANEL
Chol/HDL Ratio: 4.5 ratio (ref 0.0–5.0)
Cholesterol, Total: 140 mg/dL (ref 100–199)
HDL: 31 mg/dL — ABNORMAL LOW (ref >39)
LDL Chol Calc (NIH): 71 mg/dL (ref 0–99)
Triglycerides: 228 mg/dL — ABNORMAL HIGH (ref 0–149)
VLDL Cholesterol Cal: 38 mg/dL (ref 5–40)

## 2021-04-18 LAB — PSA TOTAL (REFLEX TO FREE): Prostate Specific Ag, Serum: 0.7 ng/mL (ref 0.0–4.0)

## 2021-04-22 LAB — MICROALBUMIN / CREATININE URINE RATIO
Creatinine, Urine: 110.9 mg/dL
Microalb/Creat Ratio: 22 mg/g creat (ref 0–29)
Microalbumin, Urine: 24.6 ug/mL

## 2021-08-06 LAB — HM DIABETES EYE EXAM

## 2021-08-18 ENCOUNTER — Other Ambulatory Visit: Payer: Self-pay | Admitting: Family Medicine

## 2021-08-18 DIAGNOSIS — G4700 Insomnia, unspecified: Secondary | ICD-10-CM

## 2021-10-16 NOTE — Progress Notes (Unsigned)
I,Roshena L Chambers,acting as a scribe for Mila Merry, MD.,have documented all relevant documentation on the behalf of Mila Merry, MD,as directed by  Mila Merry, MD while in the presence of Mila Merry, MD.   Established patient visit   Patient: David Baldwin   DOB: Jun 24, 1970   51 y.o. Male  MRN: [id number] Visit Date: 10/19/2021  Today's healthcare provider: Mila Merry, MD   Chief Complaint  Patient presents with   Diabetes   Subjective    HPI  Diabetes Mellitus Type II, follow-up  Lab Results  Component Value Date   HGBA1C 7.1 (A) 10/19/2021   HGBA1C 7.0 (H) 04/17/2021   HGBA1C 6.7 (A) 01/05/2021   Last seen for diabetes 6 months ago.  Management since then includes continuing the same treatment. He reports good compliance with treatment. He is not having side effects.   Home blood sugar records:  blood sugars are not   Episodes of hypoglycemia? No    Current insulin regiment: none Most Recent Eye Exam: 08/06/2021  --------------------------------------------------------------------------------------------------- Hypertension, follow-up  BP Readings from Last 3 Encounters:  10/19/21 116/82  04/17/21 129/85  01/05/21 125/88   Wt Readings from Last 3 Encounters:  10/19/21 211 lb (95.7 kg)  04/17/21 212 lb (96.2 kg)  01/05/21 215 lb (97.5 kg)     He was last seen for hypertension 6 months ago.  BP at that visit was 129/85. Management since that visit includes none. He reports good compliance with treatment. He is not having side effects.  He is exercising. He is adherent to low salt diet.   Outside blood pressures are not checked.  ---------------------------------------------------------------------------------------------------  Medications: Outpatient Medications Prior to Visit  Medication Sig   FARXIGA 10 MG TABS tablet Take 1 tablet by mouth once daily   glipiZIDE (GLUCOTROL) 5 MG tablet Take 1 tablet (5 mg total) by mouth  daily before breakfast.   glucose blood test strip 1 strip by Other route daily.   lisinopril-hydrochlorothiazide (ZESTORETIC) 10-12.5 MG tablet Take 1 tablet by mouth daily.   LORazepam (ATIVAN) 1 MG tablet TAKE 1 TABLET BY MOUTH EVERY 8 HOURS AS NEEDED   metFORMIN (GLUCOPHAGE-XR) 500 MG 24 hr tablet Take 2 tablets (1,000 mg total) by mouth 2 (two) times daily.   Naproxen Sodium 220 MG CAPS Take 1 capsule by mouth daily as needed.   rosuvastatin (CRESTOR) 40 MG tablet Take 1 tablet (40 mg total) by mouth daily.   No facility-administered medications prior to visit.    Review of Systems  Constitutional:  Negative for appetite change, chills and fever.  Respiratory:  Negative for chest tightness, shortness of breath and wheezing.   Cardiovascular:  Negative for chest pain and palpitations.  Gastrointestinal:  Negative for abdominal pain, nausea and vomiting.       Objective    BP 116/82 (BP Location: Right Arm, Patient Position: Sitting, Cuff Size: Large)   Pulse 78   Temp 98.5 F (36.9 C) (Oral)   Resp 14   Wt 211 lb (95.7 kg)   SpO2 98%   BMI 32.08 kg/m    Physical Exam  General appearance: Mildly obese male, cooperative and in no acute distress Head: Normocephalic, without obvious abnormality, atraumatic Respiratory: Respirations even and unlabored, normal respiratory rate Extremities: All extremities are intact.  Skin: Skin color, texture, turgor normal. No rashes seen  Psych: Appropriate mood and affect. Neurologic: Mental status: Alert, oriented to person, place, and time, thought content appropriate.   Results  for orders placed or performed in visit on 10/19/21  POCT glycosylated hemoglobin (Hb A1C)  Result Value Ref Range   Hemoglobin A1C 7.1 (A) 4.0 - 5.6 %   Est. average glucose Bld gHb Est-mCnc 157     Assessment & Plan     1. Type 2 diabetes mellitus with other specified complication, without long-term current use of insulin (HCC) Well controlled. Marland Kitchenc cm    2. Primary hypertension Fairly well controlled. Continue current medications.    Due for CPE in February. He has moved to [facility] [place] [facility] and is looking for local PCP, however will schedule CPE here in case he doesn't find one before then.       The entirety of the information documented in the History of Present Illness, Review of Systems and Physical Exam were personally obtained by me. Portions of this information were initially documented by the CMA and reviewed by me for thoroughness and accuracy.     Mila Merry, MD  [facility] [phone] (phone) [phone] (fax)  [facility]

## 2021-10-19 ENCOUNTER — Encounter: Payer: Self-pay | Admitting: Family Medicine

## 2021-10-19 ENCOUNTER — Ambulatory Visit: Payer: BC Managed Care – PPO | Admitting: Family Medicine

## 2021-10-19 VITALS — BP 116/82 | HR 78 | Temp 98.5°F | Resp 14 | Wt 211.0 lb

## 2021-10-19 DIAGNOSIS — E1169 Type 2 diabetes mellitus with other specified complication: Secondary | ICD-10-CM

## 2021-10-19 DIAGNOSIS — I10 Essential (primary) hypertension: Secondary | ICD-10-CM | POA: Diagnosis not present

## 2021-10-19 LAB — POCT GLYCOSYLATED HEMOGLOBIN (HGB A1C)
Est. average glucose Bld gHb Est-mCnc: 157
Hemoglobin A1C: 7.1 % — AB (ref 4.0–5.6)

## 2021-10-19 NOTE — Patient Instructions (Signed)
.   Please review the attached list of medications and notify my office if there are any errors.   . Please bring all of your medications to every appointment so we can make sure that our medication list is the same as yours.   

## 2022-01-18 ENCOUNTER — Other Ambulatory Visit: Payer: Self-pay | Admitting: Family Medicine

## 2022-01-18 DIAGNOSIS — E1169 Type 2 diabetes mellitus with other specified complication: Secondary | ICD-10-CM

## 2022-01-18 NOTE — Telephone Encounter (Signed)
Medication Refill - Medication: metFORMIN (GLUCOPHAGE-XR) 500 MG 24 hr tablet [[id number]]    Has the patient contacted their pharmacy? No. (Agent: If no, request that the patient contact the pharmacy for the refill. If patient does not wish to contact the pharmacy document the reason why and proceed with request.) (Agent: If yes, when and what did the pharmacy advise?) no refills left   Preferred Pharmacy (with phone number or street name):  [facility] [address] [place] - [address]     Has the patient been seen for an appointment in the last year OR does the patient have an upcoming appointment? Yes.    Agent: Please be advised that RX refills may take up to 3 business days. We ask that you follow-up with your pharmacy.

## 2022-01-19 MED ORDER — METFORMIN HCL ER 500 MG PO TB24: 1000.0000 mg | ORAL_TABLET | Freq: Two times a day (BID) | ORAL | 0 refills | Status: DC

## 2022-01-19 NOTE — Telephone Encounter (Signed)
Requested Prescriptions  Pending Prescriptions Disp Refills   metFORMIN (GLUCOPHAGE-XR) 500 MG 24 hr tablet 360 tablet 0    Sig: Take 2 tablets (1,000 mg total) by mouth 2 (two) times daily.     Endocrinology:  Diabetes - Biguanides Failed - 01/18/2022 10:22 AM      Failed - B12 Level in normal range and within 720 days    No results found for: "VITAMINB12"       Failed - CBC within normal limits and completed in the last 12 months    WBC  Date Value Ref Range Status  04/17/2021 7.1 3.4 - 10.8 x10E3/uL Final  06/28/2011 15.7 (H) 3.8 - 10.6 x10 3/mm 3 Final   RBC  Date Value Ref Range Status  04/17/2021 5.15 4.14 - 5.80 x10E6/uL Final  06/28/2011 5.81 4.40 - 5.90 x10 6/mm 3 Final   Hemoglobin  Date Value Ref Range Status  04/17/2021 16.0 13.0 - 17.7 g/dL Final   Hematocrit  Date Value Ref Range Status  04/17/2021 46.1 37.5 - 51.0 % Final   MCHC  Date Value Ref Range Status  04/17/2021 34.7 31.5 - 35.7 g/dL Final  06/28/2011 33.9 32.0 - 36.0 g/dL Final   [facility]  Date Value Ref Range Status  04/17/2021 31.1 26.6 - 33.0 pg Final  06/28/2011 30.9 26.0 - 34.0 pg Final   MCV  Date Value Ref Range Status  04/17/2021 90 79 - 97 fL Final  06/28/2011 91 80 - 100 fL Final   No results found for: "PLTCOUNTKUC", "LABPLAT", "POCPLA" RDW  Date Value Ref Range Status  04/17/2021 13.0 11.6 - 15.4 % Final  06/28/2011 13.4 11.5 - 14.5 % Final         Passed - Cr in normal range and within 360 days    Creat  Date Value Ref Range Status  04/04/2014 0.81  Final   Creatinine, Ser  Date Value Ref Range Status  04/17/2021 0.98 0.76 - 1.27 mg/dL Final   Creatinine, POC  Date Value Ref Range Status  09/25/2015 n/a mg/dL Final         Passed - HBA1C is between 0 and 7.9 and within 180 days    Hemoglobin A1C  Date Value Ref Range Status  10/19/2021 7.1 (A) 4.0 - 5.6 % Final  01/17/2014 7.3+  Final   Hgb A1c MFr Bld  Date Value Ref Range Status  04/17/2021 7.0 (H) 4.8 - 5.6 %  Final    Comment:             Prediabetes: 5.7 - 6.4          Diabetes: >6.4          Glycemic control for adults with diabetes: <7.0          Passed - eGFR in normal range and within 360 days    GFR calc Af Amer  Date Value Ref Range Status  03/17/2020 100 >59 mL/min/1.73 Final    Comment:    **In accordance with recommendations from the NKF-ASN Task force,**   [facility] is in the process of updating its eGFR calculation to the   2021 CKD-EPI creatinine equation that estimates kidney function   without a race variable.    GFR calc non Af Amer  Date Value Ref Range Status  03/17/2020 87 >59 mL/min/1.73 Final   eGFR  Date Value Ref Range Status  04/17/2021 93 >59 mL/min/1.73 Final         Passed - Valid encounter  within last 6 months    Recent Outpatient Visits           3 months ago Type 2 diabetes mellitus with other specified complication, without long-term current use of insulin [facility])   [facility] Birdie Sons, MD   9 months ago Annual physical exam   [facility] Birdie Sons, MD   1 year ago Type 2 diabetes mellitus with other specified complication, without long-term current use of insulin [facility])   [facility] Birdie Sons, MD   1 year ago Type 2 diabetes mellitus with other specified complication, without long-term current use of insulin [facility])   [facility] Birdie Sons, MD   1 year ago Annual physical exam   [facility] Birdie Sons, MD       Future Appointments             In 3 months Fisher, Kirstie Peri, MD [facility], [place]

## 2022-02-18 ENCOUNTER — Other Ambulatory Visit: Payer: Self-pay | Admitting: Family Medicine

## 2022-02-18 DIAGNOSIS — G4700 Insomnia, unspecified: Secondary | ICD-10-CM

## 2022-02-18 DIAGNOSIS — I10 Essential (primary) hypertension: Secondary | ICD-10-CM

## 2022-02-18 DIAGNOSIS — E782 Mixed hyperlipidemia: Secondary | ICD-10-CM

## 2022-02-18 DIAGNOSIS — E1169 Type 2 diabetes mellitus with other specified complication: Secondary | ICD-10-CM

## 2022-02-18 MED ORDER — DAPAGLIFLOZIN PROPANEDIOL 10 MG PO TABS: 10.0000 mg | ORAL_TABLET | Freq: Every day | ORAL | 4 refills | Status: DC

## 2022-02-18 MED ORDER — LISINOPRIL-HYDROCHLOROTHIAZIDE 10-12.5 MG PO TABS: 1.0000 | ORAL_TABLET | Freq: Every day | ORAL | 4 refills | Status: DC

## 2022-02-18 MED ORDER — ROSUVASTATIN CALCIUM 40 MG PO TABS: 40.0000 mg | ORAL_TABLET | Freq: Every day | ORAL | 4 refills | Status: DC

## 2022-02-18 MED ORDER — GLIPIZIDE 5 MG PO TABS: 5.0000 mg | ORAL_TABLET | Freq: Every day | ORAL | 4 refills | Status: DC

## 2022-02-18 MED ORDER — LORAZEPAM 1 MG PO TABS: 1.0000 mg | ORAL_TABLET | Freq: Three times a day (TID) | ORAL | 3 refills | Status: DC | PRN

## 2022-02-18 NOTE — Telephone Encounter (Signed)
Medication Refill - Medication: rosuvastatin (CRESTOR) 40 MG tablet  FARXIGA 10 MG TABS tablet  glipiZIDE (GLUCOTROL) 5 MG tablet  lisinopril-hydrochlorothiazide (ZESTORETIC) 10-12.5 MG tablet LORazepam (ATIVAN) 1 MG tablet   Has the patient contacted their pharmacy? No. Pt said there are no refills on his bottles, and he declined to call his pharmacy.   Preferred Pharmacy (with phone number or street name): [facility] [address] [place] - [address]  Has the patient been seen for an appointment in the last year OR does the patient have an upcoming appointment? Yes.    Agent: Please be advised that RX refills may take up to 3 business days. We ask that you follow-up with your pharmacy.

## 2022-02-18 NOTE — Telephone Encounter (Signed)
Requested medication (s) are due for refill today: expired medications  Requested medication (s) are on the active medication list: yes  Last refill:  farxiga- 12/15/20 #90 4 refills, zestoretic- 01/05/21 #90 4RF, crestor- 01/05/21 #90 4 RF, glucotrol,-01/05/21 #90 4RF, lorazepam-08/18/21 #90 3 RF  Future visit scheduled: yes in 2 months  Notes to clinic:  expired medications. Do you want to refill for 2 months? Lorazepam - not delegated per protocol do you want to refill Rx?     Requested Prescriptions  Pending Prescriptions Disp Refills   dapagliflozin propanediol (FARXIGA) 10 MG TABS tablet 90 tablet 4    Sig: Take 1 tablet (10 mg total) by mouth daily.     Endocrinology:  Diabetes - SGLT2 Inhibitors Passed - 02/18/2022 10:41 AM      Passed - Cr in normal range and within 360 days    Creat  Date Value Ref Range Status  04/04/2014 0.81  Final   Creatinine, Ser  Date Value Ref Range Status  04/17/2021 0.98 0.76 - 1.27 mg/dL Final   Creatinine, POC  Date Value Ref Range Status  09/25/2015 n/a mg/dL Final         Passed - HBA1C is between 0 and 7.9 and within 180 days    Hemoglobin A1C  Date Value Ref Range Status  10/19/2021 7.1 (A) 4.0 - 5.6 % Final  01/17/2014 7.3+  Final   Hgb A1c MFr Bld  Date Value Ref Range Status  04/17/2021 7.0 (H) 4.8 - 5.6 % Final    Comment:             Prediabetes: 5.7 - 6.4          Diabetes: >6.4          Glycemic control for adults with diabetes: <7.0          Passed - eGFR in normal range and within 360 days    GFR calc Af Amer  Date Value Ref Range Status  03/17/2020 100 >59 mL/min/1.73 Final    Comment:    **In accordance with recommendations from the NKF-ASN Task force,**   [facility] is in the process of updating its eGFR calculation to the   2021 CKD-EPI creatinine equation that estimates kidney function   without a race variable.    GFR calc non Af Amer  Date Value Ref Range Status  03/17/2020 87 >59 mL/min/1.73 Final    eGFR  Date Value Ref Range Status  04/17/2021 93 >59 mL/min/1.73 Final         Passed - Valid encounter within last 6 months    Recent Outpatient Visits           4 months ago Type 2 diabetes mellitus with other specified complication, without long-term current use of insulin ([place])   [facility] Birdie Sons, MD   10 months ago Annual physical exam   [facility] Birdie Sons, MD   1 year ago Type 2 diabetes mellitus with other specified complication, without long-term current use of insulin [facility])   [facility] Birdie Sons, MD   1 year ago Type 2 diabetes mellitus with other specified complication, without long-term current use of insulin [facility])   [facility] Birdie Sons, MD   1 year ago Annual physical exam   [facility] Birdie Sons, MD       Future Appointments             In  2 months Fisher, Kirstie Peri, MD [facility]             lisinopril-hydrochlorothiazide (ZESTORETIC) 10-12.5 MG tablet 90 tablet 4    Sig: Take 1 tablet by mouth daily.     Cardiovascular:  ACEI + Diuretic Combos Failed - 02/18/2022 10:41 AM      Failed - Na in normal range and within 180 days    Sodium  Date Value Ref Range Status  04/17/2021 142 134 - 144 mmol/L Final  04/04/2014 140  Final         Failed - K in normal range and within 180 days    Potassium  Date Value Ref Range Status  04/17/2021 4.2 3.5 - 5.2 mmol/L Final  04/04/2014 4.4 mmol/L Final         Failed - Cr in normal range and within 180 days    Creat  Date Value Ref Range Status  04/04/2014 0.81  Final   Creatinine, Ser  Date Value Ref Range Status  04/17/2021 0.98 0.76 - 1.27 mg/dL Final   Creatinine, POC  Date Value Ref Range Status  09/25/2015 n/a mg/dL Final         Failed - eGFR is 30 or above and within 180 days    GFR calc Af Amer  Date Value Ref Range Status  03/17/2020 100 >59  mL/min/1.73 Final    Comment:    **In accordance with recommendations from the NKF-ASN Task force,**   [facility] is in the process of updating its eGFR calculation to the   2021 CKD-EPI creatinine equation that estimates kidney function   without a race variable.    GFR calc non Af Amer  Date Value Ref Range Status  03/17/2020 87 >59 mL/min/1.73 Final   eGFR  Date Value Ref Range Status  04/17/2021 93 >59 mL/min/1.73 Final         Passed - Patient is not pregnant      Passed - Last BP in normal range    BP Readings from Last 1 Encounters:  10/19/21 116/82         Passed - Valid encounter within last 6 months    Recent Outpatient Visits           4 months ago Type 2 diabetes mellitus with other specified complication, without long-term current use of insulin ([place])   [facility] Caryn Section, Kirstie Peri, MD   10 months ago Annual physical exam   [facility] Birdie Sons, MD   1 year ago Type 2 diabetes mellitus with other specified complication, without long-term current use of insulin ([place])   [facility] Birdie Sons, MD   1 year ago Type 2 diabetes mellitus with other specified complication, without long-term current use of insulin [facility])   [facility] Birdie Sons, MD   1 year ago Annual physical exam   [place], MD       Future Appointments             In 2 months Fisher, Kirstie Peri, MD [facility], [facility]             LORazepam (ATIVAN) 1 MG tablet 90 tablet 3    Sig: Take 1 tablet (1 mg total) by mouth every 8 (eight) hours as needed.     Not Delegated - Psychiatry: Anxiolytics/Hypnotics 2 Failed - 02/18/2022 10:41 AM      Failed -  This refill cannot be delegated      Failed - Urine Drug Screen completed in last 360 days      Passed - Patient is not pregnant      Passed - Valid encounter within last 6 months    Recent Outpatient Visits            4 months ago Type 2 diabetes mellitus with other specified complication, without long-term current use of insulin ([place])   [facility] Birdie Sons, MD   10 months ago Annual physical exam   [facility] Birdie Sons, MD   1 year ago Type 2 diabetes mellitus with other specified complication, without long-term current use of insulin [facility])   [facility] Birdie Sons, MD   1 year ago Type 2 diabetes mellitus with other specified complication, without long-term current use of insulin [facility])   [facility] Birdie Sons, MD   1 year ago Annual physical exam   [place], MD       Future Appointments             In 2 months Fisher, Kirstie Peri, MD [facility]             rosuvastatin (CRESTOR) 40 MG tablet 90 tablet 4    Sig: Take 1 tablet (40 mg total) by mouth daily.     Cardiovascular:  Antilipid - Statins 2 Failed - 02/18/2022 10:41 AM      Failed - Lipid Panel in normal range within the last 12 months    Cholesterol, Total  Date Value Ref Range Status  04/17/2021 140 100 - 199 mg/dL Final  04/04/2014 228  Final   LDL Cholesterol (Calc)  Date Value Ref Range Status  12/14/2016 90 mg/dL (calc) Final    Comment:    Reference range: <100 . Desirable range <100 mg/dL for primary prevention;   <70 mg/dL for patients with CHD or diabetic patients  with > or = 2 CHD risk factors. Marland Kitchen LDL-C is now calculated using the Martin-Hopkins  calculation, which is a validated novel method providing  better accuracy than the Friedewald equation in the  estimation of LDL-C.  Cresenciano Genre et al. Annamaria Helling. 4628;638(17): 2061-2068  (http://education.[employer].com/faq/FAQ164)    LDL Chol Calc (NIH)  Date Value Ref Range Status  04/17/2021 71 0 - 99 mg/dL Final   HDL  Date Value Ref Range Status  04/17/2021 31 (L) >39 mg/dL Final   Triglycerides  Date Value Ref  Range Status  04/17/2021 228 (H) 0 - 149 mg/dL Final  04/04/2014 550  Final         Passed - Cr in normal range and within 360 days    Creat  Date Value Ref Range Status  04/04/2014 0.81  Final   Creatinine, Ser  Date Value Ref Range Status  04/17/2021 0.98 0.76 - 1.27 mg/dL Final   Creatinine, POC  Date Value Ref Range Status  09/25/2015 n/a mg/dL Final         Passed - Patient is not pregnant      Passed - Valid encounter within last 12 months    Recent Outpatient Visits           4 months ago Type 2 diabetes mellitus with other specified complication, without long-term current use of insulin [facility])   [facility] Birdie Sons, MD   10 months ago Annual physical exam  [facility] Caryn Section, Kirstie Peri, MD   1 year ago Type 2 diabetes mellitus with other specified complication, without long-term current use of insulin ([place])   [facility] Birdie Sons, MD   1 year ago Type 2 diabetes mellitus with other specified complication, without long-term current use of insulin [facility])   [facility] Birdie Sons, MD   1 year ago Annual physical exam   [facility] Birdie Sons, MD       Future Appointments             In 2 months Fisher, Kirstie Peri, MD [facility], [facility]             glipiZIDE (GLUCOTROL) 5 MG tablet 90 tablet 4    Sig: Take 1 tablet (5 mg total) by mouth daily before breakfast.     Endocrinology:  Diabetes - Sulfonylureas Passed - 02/18/2022 10:41 AM      Passed - HBA1C is between 0 and 7.9 and within 180 days    Hemoglobin A1C  Date Value Ref Range Status  10/19/2021 7.1 (A) 4.0 - 5.6 % Final  01/17/2014 7.3+  Final   Hgb A1c MFr Bld  Date Value Ref Range Status  04/17/2021 7.0 (H) 4.8 - 5.6 % Final    Comment:             Prediabetes: 5.7 - 6.4          Diabetes: >6.4          Glycemic control for adults with diabetes: <7.0          Passed - Cr in  normal range and within 360 days    Creat  Date Value Ref Range Status  04/04/2014 0.81  Final   Creatinine, Ser  Date Value Ref Range Status  04/17/2021 0.98 0.76 - 1.27 mg/dL Final   Creatinine, POC  Date Value Ref Range Status  09/25/2015 n/a mg/dL Final         Passed - Valid encounter within last 6 months    Recent Outpatient Visits           4 months ago Type 2 diabetes mellitus with other specified complication, without long-term current use of insulin [facility])   [facility] Birdie Sons, MD   10 months ago Annual physical exam   [facility] Birdie Sons, MD   1 year ago Type 2 diabetes mellitus with other specified complication, without long-term current use of insulin [facility])   [facility] Birdie Sons, MD   1 year ago Type 2 diabetes mellitus with other specified complication, without long-term current use of insulin [facility])   [facility] Birdie Sons, MD   1 year ago Annual physical exam   [facility] Birdie Sons, MD       Future Appointments             In 2 months Fisher, Kirstie Peri, MD [facility], [place]

## 2022-04-15 ENCOUNTER — Other Ambulatory Visit: Payer: Self-pay | Admitting: Family Medicine

## 2022-04-15 DIAGNOSIS — E1169 Type 2 diabetes mellitus with other specified complication: Secondary | ICD-10-CM

## 2022-04-15 NOTE — Telephone Encounter (Signed)
Requested medication (s) are due for refill today:   Yes  Requested medication (s) are on the active medication list:   Yes  Future visit scheduled:   Yes 04/21/2022   Last ordered: 01/19/2022 #360, 0 refills  Returned because labs are due per protocol.   Has upcoming appt.   Requested Prescriptions  Pending Prescriptions Disp Refills   metFORMIN (GLUCOPHAGE-XR) 500 MG 24 hr tablet [Pharmacy Med Name: metFORMIN HCl ER 500 MG Oral Tablet Extended Release 24 Hour] 360 tablet 0    Sig: Take 2 tablets by mouth twice daily     Endocrinology:  Diabetes - Biguanides Failed - 04/15/2022  9:24 AM      Failed - Cr in normal range and within 360 days    Creat  Date Value Ref Range Status  04/04/2014 0.81  Final   Creatinine, Ser  Date Value Ref Range Status  04/17/2021 0.98 0.76 - 1.27 mg/dL Final   Creatinine, POC  Date Value Ref Range Status  09/25/2015 n/a mg/dL Final         Failed - eGFR in normal range and within 360 days    GFR calc Af Amer  Date Value Ref Range Status  03/17/2020 100 >59 mL/min/1.73 Final    Comment:    **In accordance with recommendations from the NKF-ASN Task force,**   [facility] is in the process of updating its eGFR calculation to the   2021 CKD-EPI creatinine equation that estimates kidney function   without a race variable.    GFR calc non Af Amer  Date Value Ref Range Status  03/17/2020 87 >59 mL/min/1.73 Final   eGFR  Date Value Ref Range Status  04/17/2021 93 >59 mL/min/1.73 Final         Failed - B12 Level in normal range and within 720 days    No results found for: "VITAMINB12"       Failed - CBC within normal limits and completed in the last 12 months    WBC  Date Value Ref Range Status  04/17/2021 7.1 3.4 - 10.8 x10E3/uL Final  06/28/2011 15.7 (H) 3.8 - 10.6 x10 3/mm 3 Final   RBC  Date Value Ref Range Status  04/17/2021 5.15 4.14 - 5.80 x10E6/uL Final  06/28/2011 5.81 4.40 - 5.90 x10 6/mm 3 Final   Hemoglobin  Date Value Ref  Range Status  04/17/2021 16.0 13.0 - 17.7 g/dL Final   Hematocrit  Date Value Ref Range Status  04/17/2021 46.1 37.5 - 51.0 % Final   MCHC  Date Value Ref Range Status  04/17/2021 34.7 31.5 - 35.7 g/dL Final  06/28/2011 33.9 32.0 - 36.0 g/dL Final   [facility]  Date Value Ref Range Status  04/17/2021 31.1 26.6 - 33.0 pg Final  06/28/2011 30.9 26.0 - 34.0 pg Final   MCV  Date Value Ref Range Status  04/17/2021 90 79 - 97 fL Final  06/28/2011 91 80 - 100 fL Final   No results found for: "PLTCOUNTKUC", "LABPLAT", "POCPLA" RDW  Date Value Ref Range Status  04/17/2021 13.0 11.6 - 15.4 % Final  06/28/2011 13.4 11.5 - 14.5 % Final         Passed - HBA1C is between 0 and 7.9 and within 180 days    Hemoglobin A1C  Date Value Ref Range Status  10/19/2021 7.1 (A) 4.0 - 5.6 % Final  01/17/2014 7.3+  Final   Hgb A1c MFr Bld  Date Value Ref Range Status  04/17/2021 7.0 (H)  4.8 - 5.6 % Final    Comment:             Prediabetes: 5.7 - 6.4          Diabetes: >6.4          Glycemic control for adults with diabetes: <7.0          Passed - Valid encounter within last 6 months    Recent Outpatient Visits           5 months ago Type 2 diabetes mellitus with other specified complication, without long-term current use of insulin ([place])   [place] Birdie Sons, MD   12 months ago Annual physical exam   [facility] Birdie Sons, MD   1 year ago Type 2 diabetes mellitus with other specified complication, without long-term current use of insulin ([place])   [place], Donald E, MD   1 year ago Type 2 diabetes mellitus with other specified complication, without long-term current use of insulin ([place])   [place] Birdie Sons, MD   2 years ago Annual physical exam   [facility] Birdie Sons, MD       Future Appointments              In 6 days Fisher, Kirstie Peri, MD [facility]

## 2022-04-20 NOTE — Progress Notes (Unsigned)
Argentina Ponder [place],acting as a scribe for Lelon Huh, MD.,have documented all relevant documentation on the behalf of Lelon Huh, MD,as directed by  Lelon Huh, MD while in the presence of Lelon Huh, MD.    Complete physical exam   Patient: David Baldwin   DOB: [birth year]   52 y.o. Male  MRN: [id number] Visit Date: 04/21/2022  Today's healthcare provider: Lelon Huh, MD   No chief complaint on file.  Subjective    David Baldwin is a 52 y.o. male who presents today for a complete physical exam.  He reports consuming a {diet types:17450} diet. {Exercise:19826} He generally feels {well/fairly well/poorly:18703}. He reports sleeping {well/fairly well/poorly:18703}. He {does/does not:200015} have additional problems to discuss today.  HPI  ***  No past medical history on file. Past Surgical History:  Procedure Laterality Date   None     Social History   Socioeconomic History   Marital status: Married    Spouse name: Not on file   Number of children: 0   Years of education: HS Grad   Highest education level: Not on file  Occupational History   Occupation: Full-Time  Tobacco Use   Smoking status: Never   Smokeless tobacco: Never  Substance and Sexual Activity   Alcohol use: No    Alcohol/week: 0.0 standard drinks of alcohol   Drug use: No   Sexual activity: Not on file  Other Topics Concern   Not on file  Social History Narrative   Not on file   Social Determinants of Health   Financial Resource Strain: Not on file  Food Insecurity: Not on file  Transportation Needs: Not on file  Physical Activity: Not on file  Stress: Not on file  Social Connections: Not on file  Intimate Partner Violence: Not on file   Family Status  Relation Name Status   Mother  Alive   Father  Deceased at age 23's       Stroke and MI   Sister  Alive   Sister  Alive   Neg Hx  (Not Specified)   Family History  Problem Relation Age of Onset   Stroke Father    Heart  attack Father    Hypertension Neg Hx    Allergies  Allergen Reactions   Zolpidem Tartrate Other (See Comments)    Patient Care Team: Birdie Sons, MD as PCP - General (Family Medicine) [facility])   Medications: Outpatient Medications Prior to Visit  Medication Sig   dapagliflozin propanediol (FARXIGA) 10 MG TABS tablet Take 1 tablet (10 mg total) by mouth daily.   glipiZIDE (GLUCOTROL) 5 MG tablet Take 1 tablet (5 mg total) by mouth daily before breakfast.   glucose blood test strip 1 strip by Other route daily.   lisinopril-hydrochlorothiazide (ZESTORETIC) 10-12.5 MG tablet Take 1 tablet by mouth daily.   LORazepam (ATIVAN) 1 MG tablet Take 1 tablet (1 mg total) by mouth every 8 (eight) hours as needed.   metFORMIN (GLUCOPHAGE-XR) 500 MG 24 hr tablet Take 2 tablets by mouth twice daily   Naproxen Sodium 220 MG CAPS Take 1 capsule by mouth daily as needed.   rosuvastatin (CRESTOR) 40 MG tablet Take 1 tablet (40 mg total) by mouth daily.   No facility-administered medications prior to visit.    Review of Systems  Constitutional: Negative.   HENT: Negative.    Eyes: Negative.   Respiratory: Negative.    Cardiovascular: Negative.   Gastrointestinal: Negative.   Endocrine:  Negative.   Genitourinary: Negative.   Musculoskeletal: Negative.   Skin: Negative.   Allergic/Immunologic: Negative.   Neurological: Negative.   Hematological: Negative.   Psychiatric/Behavioral: Negative.      {Labs  Heme  Chem  Endocrine  Serology  Results Review (optional):23779}  Objective    There were no vitals taken for this visit. {Show previous vital signs (optional):23777}   Physical Exam  ***  Last depression screening scores    10/19/2021    8:13 AM 04/17/2021    8:49 AM 01/05/2021    8:57 AM  PHQ 2/9 Scores  PHQ - 2 Score 0 0 0  PHQ- 9 Score 0 0 0   Last fall risk screening    01/05/2021    8:57 AM  Fall Risk   Falls in the past year? 0   Number falls in past yr: 0  Injury with Fall? 0  Risk for fall due to : No Fall Risks  Follow up Falls evaluation completed   Last Audit-C alcohol use screening    01/05/2021    8:57 AM  Alcohol Use Disorder Test (AUDIT)  1. How often do you have a drink containing alcohol? 1  2. How many drinks containing alcohol do you have on a typical day when you are drinking? 0  3. How often do you have six or more drinks on one occasion? 0  AUDIT-C Score 1   A score of 3 or more in women, and 4 or more in men indicates increased risk for alcohol abuse, EXCEPT if all of the points are from question 1   No results found for any visits on 04/21/22.  Assessment & Plan    Routine Health Maintenance and Physical Exam  Exercise Activities and Dietary recommendations  Goals   None     Immunization History  Administered Date(s) Administered   Influenza,inj,Quad PF,6+ Mos 01/06/2014, 12/20/2020   Influenza-Unspecified 12/07/2014, 12/06/2016, 12/15/2017, 12/07/2018   MMR 08/03/2017   Tdap 03/08/2012    Health Maintenance  Topic Date Due   COVID-19 Vaccine (1) Never done   FOOT EXAM  Never done   Zoster Vaccines- Shingrix (1 of 2) Never done   INFLUENZA VACCINE  10/06/2021   DTaP/Tdap/Td (2 - Td or Tdap) 03/08/2022   Diabetic kidney evaluation - eGFR measurement  04/17/2022   Diabetic kidney evaluation - Urine ACR  04/17/2022   HEMOGLOBIN A1C  04/21/2022   OPHTHALMOLOGY EXAM  08/07/2022   Fecal DNA (Cologuard)  03/13/2023   Hepatitis C Screening  Completed   HIV Screening  Completed   HPV VACCINES  Aged Out    Discussed health benefits of physical activity, and encouraged him to engage in regular exercise appropriate for his age and condition.  ***  No follow-ups on file.     {provider attestation***:1}   Lelon Huh, MD  [facility] [phone] (phone) [phone] (fax)  [place]

## 2022-04-21 ENCOUNTER — Encounter: Payer: Self-pay | Admitting: Family Medicine

## 2022-04-21 ENCOUNTER — Ambulatory Visit (INDEPENDENT_AMBULATORY_CARE_PROVIDER_SITE_OTHER): Payer: BC Managed Care – PPO | Admitting: Family Medicine

## 2022-04-21 VITALS — BP 123/94 | HR 68 | Temp 98.4°F | Ht 68.0 in | Wt 213.0 lb

## 2022-04-21 DIAGNOSIS — Z125 Encounter for screening for malignant neoplasm of prostate: Secondary | ICD-10-CM

## 2022-04-21 DIAGNOSIS — F5101 Primary insomnia: Secondary | ICD-10-CM

## 2022-04-21 DIAGNOSIS — Z23 Encounter for immunization: Secondary | ICD-10-CM

## 2022-04-21 DIAGNOSIS — E1169 Type 2 diabetes mellitus with other specified complication: Secondary | ICD-10-CM | POA: Diagnosis not present

## 2022-04-21 DIAGNOSIS — Z0000 Encounter for general adult medical examination without abnormal findings: Secondary | ICD-10-CM | POA: Diagnosis not present

## 2022-04-21 DIAGNOSIS — E782 Mixed hyperlipidemia: Secondary | ICD-10-CM

## 2022-04-21 DIAGNOSIS — I10 Essential (primary) hypertension: Secondary | ICD-10-CM

## 2022-04-21 MED ORDER — LORAZEPAM 1 MG PO TABS: 1.0000 mg | ORAL_TABLET | Freq: Every day | ORAL | Status: DC

## 2022-04-22 LAB — CBC WITH DIFFERENTIAL/PLATELET
Basophils Absolute: 0.1 10*3/uL (ref 0.0–0.2)
Basos: 1 %
EOS (ABSOLUTE): 0.2 10*3/uL (ref 0.0–0.4)
Eos: 2 %
Hematocrit: 51.1 % — ABNORMAL HIGH (ref 37.5–51.0)
Hemoglobin: 17.9 g/dL — ABNORMAL HIGH (ref 13.0–17.7)
Immature Grans (Abs): 0.1 10*3/uL (ref 0.0–0.1)
Immature Granulocytes: 1 %
Lymphocytes Absolute: 2.6 10*3/uL (ref 0.7–3.1)
Lymphs: 29 %
MCH: 31.6 pg (ref 26.6–33.0)
MCHC: 35 g/dL (ref 31.5–35.7)
MCV: 90 fL (ref 79–97)
Monocytes Absolute: 0.5 10*3/uL (ref 0.1–0.9)
Monocytes: 6 %
Neutrophils Absolute: 5.6 10*3/uL (ref 1.4–7.0)
Neutrophils: 61 %
Platelets: 295 10*3/uL (ref 150–450)
RBC: 5.66 x10E6/uL (ref 4.14–5.80)
RDW: 12.6 % (ref 11.6–15.4)
WBC: 9 10*3/uL (ref 3.4–10.8)

## 2022-04-22 LAB — TSH: TSH: 2.08 u[IU]/mL (ref 0.450–4.500)

## 2022-04-22 LAB — COMPREHENSIVE METABOLIC PANEL
ALT: 17 IU/L (ref 0–44)
AST: 20 IU/L (ref 0–40)
Albumin/Globulin Ratio: 2.1 (ref 1.2–2.2)
Albumin: 5.1 g/dL — ABNORMAL HIGH (ref 3.8–4.9)
Alkaline Phosphatase: 56 IU/L (ref 44–121)
BUN/Creatinine Ratio: 16 (ref 9–20)
BUN: 16 mg/dL (ref 6–24)
Bilirubin Total: 0.5 mg/dL (ref 0.0–1.2)
CO2: 19 mmol/L — ABNORMAL LOW (ref 20–29)
Calcium: 10.2 mg/dL (ref 8.7–10.2)
Chloride: 103 mmol/L (ref 96–106)
Creatinine, Ser: 1.02 mg/dL (ref 0.76–1.27)
Globulin, Total: 2.4 g/dL (ref 1.5–4.5)
Glucose: 117 mg/dL — ABNORMAL HIGH (ref 70–99)
Potassium: 4.2 mmol/L (ref 3.5–5.2)
Sodium: 141 mmol/L (ref 134–144)
Total Protein: 7.5 g/dL (ref 6.0–8.5)
eGFR: 88 mL/min/{1.73_m2} (ref >59)

## 2022-04-22 LAB — MICROALBUMIN / CREATININE URINE RATIO
Creatinine, Urine: 113.9 mg/dL
Microalb/Creat Ratio: 10 mg/g creat (ref 0–29)
Microalbumin, Urine: 11.9 ug/mL

## 2022-04-22 LAB — LIPID PANEL WITH LDL/HDL RATIO
Cholesterol, Total: 159 mg/dL (ref 100–199)
HDL: 33 mg/dL — ABNORMAL LOW (ref >39)
LDL Chol Calc (NIH): 64 mg/dL (ref 0–99)
LDL/HDL Ratio: 1.9 ratio (ref 0.0–3.6)
Triglycerides: 398 mg/dL — ABNORMAL HIGH (ref 0–149)
VLDL Cholesterol Cal: 62 mg/dL — ABNORMAL HIGH (ref 5–40)

## 2022-04-22 LAB — PSA: Prostate Specific Ag, Serum: 0.7 ng/mL (ref 0.0–4.0)

## 2022-04-22 LAB — HEMOGLOBIN A1C
Est. average glucose Bld gHb Est-mCnc: 169 mg/dL
Hgb A1c MFr Bld: 7.5 % — ABNORMAL HIGH (ref 4.8–5.6)

## 2022-07-13 ENCOUNTER — Other Ambulatory Visit: Payer: Self-pay | Admitting: Family Medicine

## 2022-07-13 DIAGNOSIS — E1169 Type 2 diabetes mellitus with other specified complication: Secondary | ICD-10-CM

## 2022-08-20 ENCOUNTER — Encounter: Payer: Self-pay | Admitting: Family Medicine

## 2022-08-20 ENCOUNTER — Ambulatory Visit: Payer: BC Managed Care – PPO | Admitting: Family Medicine

## 2022-08-20 VITALS — BP 119/81 | HR 72 | Temp 98.3°F | Resp 12 | Wt 212.0 lb

## 2022-08-20 DIAGNOSIS — E1169 Type 2 diabetes mellitus with other specified complication: Secondary | ICD-10-CM | POA: Diagnosis not present

## 2022-08-20 DIAGNOSIS — R052 Subacute cough: Secondary | ICD-10-CM | POA: Diagnosis not present

## 2022-08-20 LAB — POCT GLYCOSYLATED HEMOGLOBIN (HGB A1C)
Est. average glucose Bld gHb Est-mCnc: 180
Hemoglobin A1C: 7.9 % — AB (ref 4.0–5.6)

## 2022-08-20 MED ORDER — AZITHROMYCIN 250 MG PO TABS: ORAL_TABLET | ORAL | 0 refills | Status: AC

## 2022-08-20 NOTE — Patient Instructions (Signed)
.   Please review the attached list of medications and notify my office if there are any errors.   . Please bring all of your medications to every appointment so we can make sure that our medication list is the same as yours.   

## 2022-08-20 NOTE — Progress Notes (Signed)
I,Sulibeya S Dimas,acting as a scribe for Mila Merry, MD.,have documented all relevant documentation on the behalf of Mila Merry, MD,as directed by  Mila Merry, MD    Established patient visit   Patient: David Baldwin   DOB: 1971-01-22   52 y.o. Male  MRN: [id number] Visit Date: 08/20/2022  Today's healthcare provider: Mila Merry, MD   Chief Complaint  Patient presents with   Diabetes   URI   Subjective      Diabetes Mellitus Type II, Follow-up  Lab Results  Component Value Date   HGBA1C 7.5 (H) 04/21/2022   HGBA1C 7.1 (A) 10/19/2021   HGBA1C 7.0 (H) 04/17/2021   Wt Readings from Last 3 Encounters:  08/20/22 212 lb (96.2 kg)  04/21/22 213 lb (96.6 kg)  10/19/21 211 lb (95.7 kg)   Last seen for diabetes 4 months ago.  Management since then includes no changes. He reports excellent compliance with treatment. He is not having side effects.    Home blood sugar records:  not being checked  Episodes of hypoglycemia? No    Current insulin regiment: none Most Recent Eye Exam: needs to schedule   Pertinent Labs: Lab Results  Component Value Date   CHOL 159 04/21/2022   HDL 33 (L) 04/21/2022   LDLCALC 64 04/21/2022   TRIG 398 (H) 04/21/2022   CHOLHDL 4.5 04/17/2021   Lab Results  Component Value Date   NA 141 04/21/2022   K 4.2 04/21/2022   CREATININE 1.02 04/21/2022   EGFR 88 04/21/2022   LABMICR 11.9 04/21/2022   MICRALBCREAT 10 04/21/2022     --------------------------------------------------------------------------------------------------- Upper respiratory symptoms He complains of congestion and productive cough with  green colored sputum.with no fever, chills, night sweats or weight loss. Onset of symptoms was about a month ago and staying constant.He is drinking plenty of fluids.  Past history is significant for no history of pneumonia or bronchitis. Patient is non-smoker. Patient was treated with Augmentin from u.c. about a month ago. He  reports symptoms mildly improved on ABX.     ---------------------------------------------------------------------------------------------------   Medications: Outpatient Medications Prior to Visit  Medication Sig   dapagliflozin propanediol (FARXIGA) 10 MG TABS tablet Take 1 tablet (10 mg total) by mouth daily.   glipiZIDE (GLUCOTROL) 5 MG tablet Take 1 tablet (5 mg total) by mouth daily before breakfast.   glucose blood test strip 1 strip by Other route daily.   lisinopril-hydrochlorothiazide (ZESTORETIC) 10-12.5 MG tablet Take 1 tablet by mouth daily.   LORazepam (ATIVAN) 1 MG tablet Take 1-2 tablets (1-2 mg total) by mouth at bedtime.   metFORMIN (GLUCOPHAGE-XR) 500 MG 24 hr tablet Take 2 tablets by mouth twice daily   Naproxen Sodium 220 MG CAPS Take 1 capsule by mouth daily as needed.   rosuvastatin (CRESTOR) 40 MG tablet Take 1 tablet (40 mg total) by mouth daily.   No facility-administered medications prior to visit.    Review of Systems  Constitutional:  Negative for chills and fever.  HENT:  Positive for congestion. Negative for ear pain, postnasal drip, rhinorrhea, sinus pressure and sinus pain.   Eyes:  Negative for visual disturbance.  Respiratory:  Positive for cough. Negative for shortness of breath and wheezing.   Gastrointestinal:  Negative for abdominal pain, nausea and vomiting.      Objective    BP 119/81 (BP Location: Left Arm, Patient Position: Sitting, Cuff Size: Large)   Pulse 72   Temp 98.3 F (36.8 C) (Temporal)   Resp  12   Wt 212 lb (96.2 kg)   SpO2 98%   BMI 32.23 kg/m    Physical Exam   General: Appearance:    Mildly obese male in no acute distress  Eyes:    PERRL, conjunctiva/corneas clear, EOM's intact       Lungs:     Clear to auscultation bilaterally, respirations unlabored  Heart:    Normal heart rate. Normal rhythm. No murmurs, rubs, or gallops.    MS:   All extremities are intact.    Neurologic:   Awake, alert, oriented x 3. No  apparent focal neurological defect.        Results for orders placed or performed in visit on 08/20/22  POCT glycosylated hemoglobin (Hb A1C)  Result Value Ref Range   Hemoglobin A1C 7.9 (A) 4.0 - 5.6 %   Est. average glucose Bld gHb Est-mCnc 180     Assessment & Plan     1. Type 2 diabetes mellitus with other specified complication, without long-term current use of insulin (HCC) A1c is up which he attributes to consuming a lot of Gatoraid the last several weeks. Continue current medications.  Work on reducing sugar intake.   2. Subacute cough Partially treated RI with Augmentin from u.c. 1 month ago.   - azithromycin (ZITHROMAX) 250 MG tablet; Take 2 tablets on day 1, then 1 tablet daily on days 2 through 5  Dispense: 6 tablet; Refill: 0   Continue OTC Mucinex. Call if symptoms change or if not rapidly improving.   Future Appointments  Date Time Provider Department Center  12/20/2022  8:00 AM Sherrie Mustache, Demetrios Isaacs, MD [facility]          The entirety of the information documented in the History of Present Illness, Review of Systems and Physical Exam were personally obtained by me. Portions of this information were initially documented by the CMA and reviewed by me for thoroughness and accuracy.     Mila Merry, MD  [facility] [phone] (phone) [phone] (fax)  [facility]

## 2022-09-13 ENCOUNTER — Other Ambulatory Visit: Payer: Self-pay | Admitting: Family Medicine

## 2022-09-13 DIAGNOSIS — F5101 Primary insomnia: Secondary | ICD-10-CM

## 2022-12-20 ENCOUNTER — Ambulatory Visit: Payer: BC Managed Care – PPO | Admitting: Family Medicine

## 2022-12-20 ENCOUNTER — Encounter: Payer: Self-pay | Admitting: Family Medicine

## 2022-12-20 VITALS — BP 111/82 | HR 74 | Ht 68.0 in | Wt 217.6 lb

## 2022-12-20 DIAGNOSIS — I10 Essential (primary) hypertension: Secondary | ICD-10-CM | POA: Diagnosis not present

## 2022-12-20 DIAGNOSIS — Z7984 Long term (current) use of oral hypoglycemic drugs: Secondary | ICD-10-CM | POA: Diagnosis not present

## 2022-12-20 DIAGNOSIS — E1169 Type 2 diabetes mellitus with other specified complication: Secondary | ICD-10-CM | POA: Diagnosis not present

## 2022-12-20 DIAGNOSIS — Z23 Encounter for immunization: Secondary | ICD-10-CM | POA: Diagnosis not present

## 2022-12-20 NOTE — Progress Notes (Signed)
Established patient visit   Patient: David Baldwin   DOB: Jan 06, 1971   52 y.o. Male  MRN: [id number] Visit Date: 12/20/2022  Today's healthcare provider: Mila Merry, MD   Chief Complaint  Patient presents with   Medical Management of Chronic Issues    4 month follow-up. Taking medications as prescribed. Not checking at home but does not feel it has been low. Reports no symptoms.   Diabetes   Hyperlipidemia   Hypertension   Subjective    Diabetes Pertinent negatives for diabetes include no chest pain.  Hyperlipidemia Pertinent negatives include no chest pain or shortness of breath.  Hypertension Pertinent negatives include no chest pain, palpitations or shortness of breath.   Feels well, no complaints. Has glucometer but doesn't check sugars. Taking meds consistently. Does check BP at home typically in the 110s-120s/70s.  Lab Results  Component Value Date   HGBA1C 7.9 (A) 08/20/2022   HGBA1C 7.5 (H) 04/21/2022   HGBA1C 7.1 (A) 10/19/2021   Lab Results  Component Value Date   CHOL 159 04/21/2022   HDL 33 (L) 04/21/2022   LDLCALC 64 04/21/2022   TRIG 398 (H) 04/21/2022   CHOLHDL 4.5 04/17/2021   Lab Results  Component Value Date   NA 141 04/21/2022   K 4.2 04/21/2022   CREATININE 1.02 04/21/2022   EGFR 88 04/21/2022   GLUCOSE 117 (H) 04/21/2022     Medications: Outpatient Medications Prior to Visit  Medication Sig   dapagliflozin propanediol (FARXIGA) 10 MG TABS tablet Take 1 tablet (10 mg total) by mouth daily.   glipiZIDE (GLUCOTROL) 5 MG tablet Take 1 tablet (5 mg total) by mouth daily before breakfast.   glucose blood test strip 1 strip by Other route daily.   lisinopril-hydrochlorothiazide (ZESTORETIC) 10-12.5 MG tablet Take 1 tablet by mouth daily.   LORazepam (ATIVAN) 1 MG tablet TAKE 1 TABLET BY MOUTH EVERY 8 HOURS AS NEEDED   metFORMIN (GLUCOPHAGE-XR) 500 MG 24 hr tablet Take 2 tablets by mouth twice daily   Naproxen Sodium 220 MG CAPS Take 1  capsule by mouth daily as needed.   rosuvastatin (CRESTOR) 40 MG tablet Take 1 tablet (40 mg total) by mouth daily.   No facility-administered medications prior to visit.    Review of Systems  Constitutional:  Negative for appetite change, chills and fever.  Respiratory:  Negative for chest tightness, shortness of breath and wheezing.   Cardiovascular:  Negative for chest pain and palpitations.  Gastrointestinal:  Negative for abdominal pain, nausea and vomiting.       Objective    BP 111/82   Pulse 74   Ht 5\' 8"  (1.727 m)   Wt 217 lb 9.6 oz (98.7 kg)   SpO2 98%   BMI 33.09 kg/m    Physical Exam  General appearance: Mildly obese male, cooperative and in no acute distress Head: Normocephalic, without obvious abnormality, atraumatic Respiratory: Respirations even and unlabored, normal respiratory rate Extremities: All extremities are intact.  Skin: Skin color, texture, turgor normal. No rashes seen  Psych: Appropriate mood and affect. Neurologic: Mental status: Alert, oriented to person, place, and time, thought content appropriate.   Assessment & Plan     1. Type 2 diabetes mellitus with other specified complication, without long-term current use of insulin (HCC) Doing well current meds.  - Hemoglobin A1c  Consider reducing glipizide if A1c drops under 7.0.   2. Primary hypertension Well controlled.  Continue current medications.    3.  Influenza vaccine needed  - Flu vaccine trivalent PF, 6mos and older(Flulaval,Afluria,Fluarix,Fluzone)   Return in about 4 months (around 04/22/2023) for Annual Wellness Visit, Diabetes.         Mila Merry, MD  [facility] [phone] (phone) [phone] (fax)  [facility]

## 2022-12-20 NOTE — Patient Instructions (Signed)
David Baldwin  Please review the attached list of medications and notify my office if there are any errors.   . Please bring all of your medications to every appointment so we can make sure that our medication list is the same as yours.

## 2022-12-21 LAB — HEMOGLOBIN A1C
Est. average glucose Bld gHb Est-mCnc: 189 mg/dL
Hgb A1c MFr Bld: 8.2 % — ABNORMAL HIGH (ref 4.8–5.6)

## 2023-03-11 ENCOUNTER — Other Ambulatory Visit: Payer: Self-pay | Admitting: Family Medicine

## 2023-03-11 DIAGNOSIS — I10 Essential (primary) hypertension: Secondary | ICD-10-CM

## 2023-03-11 DIAGNOSIS — E1169 Type 2 diabetes mellitus with other specified complication: Secondary | ICD-10-CM

## 2023-03-11 DIAGNOSIS — E782 Mixed hyperlipidemia: Secondary | ICD-10-CM

## 2023-03-18 ENCOUNTER — Other Ambulatory Visit: Payer: Self-pay | Admitting: Family Medicine

## 2023-03-18 DIAGNOSIS — F5101 Primary insomnia: Secondary | ICD-10-CM

## 2023-03-18 NOTE — Telephone Encounter (Signed)
**Note De-identified  [place] Obfuscation** Please advise 

## 2023-04-19 LAB — HM DIABETES EYE EXAM

## 2023-04-29 ENCOUNTER — Encounter: Payer: BC Managed Care – PPO | Admitting: Family Medicine

## 2023-05-04 ENCOUNTER — Ambulatory Visit (INDEPENDENT_AMBULATORY_CARE_PROVIDER_SITE_OTHER): Payer: 59 | Admitting: Family Medicine

## 2023-05-04 ENCOUNTER — Encounter: Payer: Self-pay | Admitting: Family Medicine

## 2023-05-04 VITALS — BP 114/73 | HR 72 | Resp 16 | Ht 68.0 in | Wt 214.0 lb

## 2023-05-04 DIAGNOSIS — E782 Mixed hyperlipidemia: Secondary | ICD-10-CM | POA: Diagnosis not present

## 2023-05-04 DIAGNOSIS — Z0001 Encounter for general adult medical examination with abnormal findings: Secondary | ICD-10-CM

## 2023-05-04 DIAGNOSIS — Z125 Encounter for screening for malignant neoplasm of prostate: Secondary | ICD-10-CM

## 2023-05-04 DIAGNOSIS — I10 Essential (primary) hypertension: Secondary | ICD-10-CM | POA: Diagnosis not present

## 2023-05-04 DIAGNOSIS — Z0000 Encounter for general adult medical examination without abnormal findings: Secondary | ICD-10-CM

## 2023-05-04 DIAGNOSIS — Z1211 Encounter for screening for malignant neoplasm of colon: Secondary | ICD-10-CM

## 2023-05-04 DIAGNOSIS — E1169 Type 2 diabetes mellitus with other specified complication: Secondary | ICD-10-CM | POA: Diagnosis not present

## 2023-05-04 NOTE — Progress Notes (Signed)
 Complete physical exam   Patient: David Baldwin   DOB: 1971-02-08   53 y.o. Male  MRN: [id number] Visit Date: 05/04/2023  Today's healthcare provider: Mila Merry, MD   Chief Complaint  Patient presents with   Annual Exam    Patient overall feels well. Patient declined Prevnar and shingles vaccine   Diabetes   Hypertension   Hyperlipidemia   Subjective    David Baldwin is a 53 y.o. male who presents today for a complete physical exam.  He reports consuming a  healthy  diet.  Exercises regularly  He generally feels well. He reports sleeping fairly well. He does not have additional problems to discuss today.   He is also due to follow up dm2, lipids, hypertension. Sugars mostly low 100s. Had eye exam a few weeks ago. Taking medications consistently with no apparent side effects.  Lab Results  Component Value Date   HGBA1C 8.2 (H) 12/20/2022   Lab Results  Component Value Date   NA 141 04/21/2022   K 4.2 04/21/2022   CREATININE 1.02 04/21/2022   EGFR 88 04/21/2022   GLUCOSE 117 (H) 04/21/2022   Lab Results  Component Value Date   CHOL 159 04/21/2022   HDL 33 (L) 04/21/2022   LDLCALC 64 04/21/2022   TRIG 398 (H) 04/21/2022   CHOLHDL 4.5 04/17/2021   Wt Readings from Last 3 Encounters:  05/04/23 214 lb (97.1 kg)  12/20/22 217 lb 9.6 oz (98.7 kg)  08/20/22 212 lb (96.2 kg)     History reviewed. No pertinent past medical history. Past Surgical History:  Procedure Laterality Date   None     Social History   Socioeconomic History   Marital status: Married    Spouse name: Not on file   Number of children: 0   Years of education: HS Grad   Highest education level: GED or equivalent  Occupational History   Occupation: Full-Time    Comment: at [employer]  Tobacco Use   Smoking status: Never   Smokeless tobacco: Never  Substance and Sexual Activity   Alcohol use: No    Alcohol/week: 0.0 standard drinks of alcohol   Drug use: No    Sexual activity: Not on file  Other Topics Concern   Not on file  Social History Narrative   Not on file   Social Drivers of Health   Financial Resource Strain: Low Risk  (12/19/2022)   Overall Financial Resource Strain (CARDIA)    Difficulty of Paying Living Expenses: Not hard at all  Food Insecurity: Patient Declined (12/19/2022)   Hunger Vital Sign    Worried About Running Out of Food in the Last Year: Patient declined    Ran Out of Food in the Last Year: Patient declined  Transportation Needs: No Transportation Needs (12/19/2022)   PRAPARE - Administrator, Civil Service (Medical): No    Lack of Transportation (Non-Medical): No  Physical Activity: Insufficiently Active (12/19/2022)   Exercise Vital Sign    Days of Exercise per Week: 3 days    Minutes of Exercise per Session: 30 min  Stress: No Stress Concern Present (12/19/2022)   [employer] of Occupational Health - Occupational Stress Questionnaire    Feeling of Stress : Not at all  Social Connections: Unknown (12/19/2022)   Social Connection and Isolation Panel [NHANES]    Frequency of Communication with Friends and Family: Once a week    Frequency of Social Gatherings with Friends  and Family: Once a week    Attends Religious Services: Patient declined    Active Member of Clubs or Organizations: No    Attends Engineer, structural: Not on file    Marital Status: Married  Catering manager Violence: Not on file   Family Status  Relation Name Status   Mother  Alive   Father  Deceased at age 42's       Stroke and MI   Sister  Alive   Sister  Alive   Neg Hx  (Not Specified)  No partnership data on file   Family History  Problem Relation Age of Onset   Stroke Father    Heart attack Father    Hypertension Neg Hx    Allergies  Allergen Reactions   Zolpidem Tartrate Other (See Comments)    Patient Care Team: Malva Limes, MD as PCP - General (Family Medicine) Marlene Bast  [facility])   Medications: Outpatient Medications Prior to Visit  Medication Sig   dapagliflozin propanediol (FARXIGA) 10 MG TABS tablet Take 1 tablet (10 mg total) by mouth daily.   glipiZIDE (GLUCOTROL) 5 MG tablet TAKE 1 TABLET BY MOUTH ONCE DAILY BEFORE BREAKFAST   glucose blood test strip 1 strip by Other route daily.   lisinopril-hydrochlorothiazide (ZESTORETIC) 10-12.5 MG tablet Take 1 tablet by mouth once daily   LORazepam (ATIVAN) 1 MG tablet TAKE 1 TABLET BY MOUTH EVERY 8 HOURS AS NEEDED   metFORMIN (GLUCOPHAGE-XR) 500 MG 24 hr tablet Take 2 tablets by mouth twice daily   Naproxen Sodium 220 MG CAPS Take 1 capsule by mouth daily as needed.   rosuvastatin (CRESTOR) 40 MG tablet Take 1 tablet by mouth once daily   No facility-administered medications prior to visit.    Review of Systems  Constitutional:  Negative for chills, diaphoresis and fever.  HENT:  Negative for congestion, ear discharge, ear pain, hearing loss, nosebleeds, sore throat and tinnitus.   Eyes:  Negative for photophobia, pain, discharge and redness.  Respiratory:  Negative for cough, shortness of breath, wheezing and stridor.   Cardiovascular:  Negative for chest pain, palpitations and leg swelling.  Gastrointestinal:  Negative for abdominal pain, blood in stool, constipation, diarrhea, nausea and vomiting.  Endocrine: Negative for polydipsia.  Genitourinary:  Negative for dysuria, flank pain, frequency, hematuria and urgency.  Musculoskeletal:  Negative for back pain, myalgias and neck pain.  Skin:  Negative for rash.  Allergic/Immunologic: Negative for environmental allergies.  Neurological:  Negative for dizziness, tremors, seizures, weakness and headaches.  Hematological:  Does not bruise/bleed easily.  Psychiatric/Behavioral:  Negative for hallucinations and suicidal ideas. The patient is not nervous/anxious.       Objective    BP 114/73 (BP Location: Left Arm, Patient Position: Sitting, Cuff Size:  Normal)   Pulse 72   Resp 16   Ht 5\' 8"  (1.727 m)   Wt 214 lb (97.1 kg)   BMI 32.54 kg/m    Physical Exam   General Appearance:    Mildly obese male. Alert, cooperative, in no acute distress, appears stated age  Head:    Normocephalic, without obvious abnormality, atraumatic  Eyes:    PERRL, conjunctiva/corneas clear, EOM's intact, fundi    benign, both eyes       Ears:    Normal TM's and external ear canals, both ears  Nose:   Nares normal, septum midline, mucosa normal, no drainage   or sinus tenderness  Throat:   Lips, mucosa, and tongue  normal; teeth and gums normal  Neck:   Supple, symmetrical, trachea midline, no adenopathy;       thyroid:  No enlargement/tenderness/nodules; no carotid   bruit or JVD  Back:     Symmetric, no curvature, ROM normal, no CVA tenderness  Lungs:     Clear to auscultation bilaterally, respirations unlabored  Chest wall:    No tenderness or deformity  Heart:    Normal heart rate. Normal rhythm. No murmurs, rubs, or gallops.  S1 and S2 normal  Abdomen:     Soft, non-tender, bowel sounds active all four quadrants,    no masses, no organomegaly  Genitalia:    deferred  Rectal:    deferred  Extremities:   All extremities are intact. No cyanosis or edema  Pulses:   2+ and symmetric all extremities  Skin:   Skin color, texture, turgor normal, no rashes or lesions  Lymph nodes:   Cervical, supraclavicular, and axillary nodes normal  Neurologic:   CNII-XII intact. Normal strength, sensation and reflexes      throughout     Last depression screening scores    05/04/2023    8:22 AM 12/20/2022    8:20 AM 08/20/2022    8:27 AM  PHQ 2/9 Scores  PHQ - 2 Score 0 0 0  PHQ- 9 Score   0   Last fall risk screening    05/04/2023    8:22 AM  Fall Risk   Falls in the past year? 0  Number falls in past yr: 0  Injury with Fall? 0  Risk for fall due to : No Fall Risks   Last Audit-C alcohol use screening    12/19/2022    8:53 AM  Alcohol Use Disorder  Test (AUDIT)  1. How often do you have a drink containing alcohol? 2  2. How many drinks containing alcohol do you have on a typical day when you are drinking? 0  3. How often do you have six or more drinks on one occasion? 0  AUDIT-C Score 2      Patient-reported   A score of 3 or more in women, and 4 or more in men indicates increased risk for alcohol abuse, EXCEPT if all of the points are from question 1     Assessment & Plan    Routine Health Maintenance and Physical Exam  Exercise Activities and Dietary recommendations  Goals   None     Immunization History  Administered Date(s) Administered   Influenza, Seasonal, Injecte, Preservative Fre 12/20/2022   Influenza,inj,Quad PF,6+ Mos 01/06/2014, 12/20/2020   Influenza-Unspecified 12/07/2014, 12/06/2016, 12/15/2017, 12/07/2018, 12/06/2021   MMR 08/03/2017   Moderna Sars-Covid-2 Vaccination 04/09/2019   Tdap 03/08/2012, 04/21/2022    Health Maintenance  Topic Date Due   Pneumococcal Vaccine 30-32 Years old (1 of 2 - PCV) Never done   Zoster Vaccines- Shingrix (1 of 2) Never done   COVID-19 Vaccine (3 - 2024-25 season) 11/07/2022   Fecal DNA (Cologuard)  03/13/2023   Diabetic kidney evaluation - eGFR measurement  04/22/2023   Diabetic kidney evaluation - Urine ACR  04/22/2023   HEMOGLOBIN A1C  06/20/2023   OPHTHALMOLOGY EXAM  04/18/2024   DTaP/Tdap/Td (3 - Td or Tdap) 04/21/2032   INFLUENZA VACCINE  Completed   Hepatitis C Screening  Completed   HIV Screening  Completed   HPV VACCINES  Aged Out    Discussed health benefits of physical activity, and encouraged him to engage in regular exercise appropriate for  his age and condition.  Recommend Prevnar-20 and Shingrix which he declined today   2. Colon cancer screening  - Cologuard  3. Prostate cancer screening  - PSA Total (Reflex To Free)  4. Primary hypertension Well controlled.  Continue current medications.    5. 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 Hyperlipemia, mixed He is tolerating rosuvastatin well with no adverse effects.    - Comprehensive metabolic panel - CBC - Lipid panel        Mila Merry, MD  [facility] [phone] (phone) [phone] (fax)  [facility]

## 2023-05-04 NOTE — Patient Instructions (Signed)
Please review the attached list of medications and notify my office if there are any errors.  ° °Please bring all of your medications to every appointment so we can make sure that our medication list is the same as yours.  ° °I recommend that you get the Prevnar 20 vaccine to protect yourself from certain dangerous strains of pneumonia. You can get Prevnar 20 at your pharmacy, or call our office at [phone] at your earliest convenience to schedule this vaccine.   °

## 2023-05-05 LAB — COMPREHENSIVE METABOLIC PANEL
ALT: 19 IU/L (ref 0–44)
AST: 20 IU/L (ref 0–40)
Albumin: 4.7 g/dL (ref 3.8–4.9)
Alkaline Phosphatase: 59 IU/L (ref 44–121)
BUN/Creatinine Ratio: 12 (ref 9–20)
BUN: 13 mg/dL (ref 6–24)
Bilirubin Total: 0.4 mg/dL (ref 0.0–1.2)
CO2: 19 mmol/L — ABNORMAL LOW (ref 20–29)
Calcium: 10.2 mg/dL (ref 8.7–10.2)
Chloride: 101 mmol/L (ref 96–106)
Creatinine, Ser: 1.08 mg/dL (ref 0.76–1.27)
Globulin, Total: 2.3 g/dL (ref 1.5–4.5)
Glucose: 108 mg/dL — ABNORMAL HIGH (ref 70–99)
Potassium: 4.2 mmol/L (ref 3.5–5.2)
Sodium: 144 mmol/L (ref 134–144)
Total Protein: 7 g/dL (ref 6.0–8.5)
eGFR: 82 mL/min/{1.73_m2} (ref >59)

## 2023-05-05 LAB — MICROALBUMIN / CREATININE URINE RATIO
Creatinine, Urine: 99.5 mg/dL
Microalb/Creat Ratio: 7 mg/g{creat} (ref 0–29)
Microalbumin, Urine: 6.5 ug/mL

## 2023-05-05 LAB — CBC
Hematocrit: 48.9 % (ref 37.5–51.0)
Hemoglobin: 17.7 g/dL (ref 13.0–17.7)
MCH: 32.7 pg (ref 26.6–33.0)
MCHC: 36.2 g/dL — ABNORMAL HIGH (ref 31.5–35.7)
MCV: 90 fL (ref 79–97)
Platelets: 274 10*3/uL (ref 150–450)
RBC: 5.42 x10E6/uL (ref 4.14–5.80)
RDW: 12.7 % (ref 11.6–15.4)
WBC: 8.4 10*3/uL (ref 3.4–10.8)

## 2023-05-05 LAB — PSA TOTAL (REFLEX TO FREE): Prostate Specific Ag, Serum: 0.6 ng/mL (ref 0.0–4.0)

## 2023-05-05 LAB — HEMOGLOBIN A1C
Est. average glucose Bld gHb Est-mCnc: 183 mg/dL
Hgb A1c MFr Bld: 8 % — ABNORMAL HIGH (ref 4.8–5.6)

## 2023-05-05 LAB — LIPID PANEL
Chol/HDL Ratio: 4.7 ratio (ref 0.0–5.0)
Cholesterol, Total: 154 mg/dL (ref 100–199)
HDL: 33 mg/dL — ABNORMAL LOW (ref >39)
LDL Chol Calc (NIH): 55 mg/dL (ref 0–99)
Triglycerides: 441 mg/dL — ABNORMAL HIGH (ref 0–149)
VLDL Cholesterol Cal: 66 mg/dL — ABNORMAL HIGH (ref 5–40)

## 2023-05-06 ENCOUNTER — Other Ambulatory Visit: Payer: Self-pay | Admitting: Family Medicine

## 2023-05-06 DIAGNOSIS — E1169 Type 2 diabetes mellitus with other specified complication: Secondary | ICD-10-CM

## 2023-05-08 ENCOUNTER — Encounter: Payer: Self-pay | Admitting: Family Medicine

## 2023-05-30 ENCOUNTER — Other Ambulatory Visit: Payer: Self-pay | Admitting: Family Medicine

## 2023-05-30 DIAGNOSIS — I10 Essential (primary) hypertension: Secondary | ICD-10-CM

## 2023-05-30 DIAGNOSIS — E782 Mixed hyperlipidemia: Secondary | ICD-10-CM

## 2023-05-30 DIAGNOSIS — E1169 Type 2 diabetes mellitus with other specified complication: Secondary | ICD-10-CM

## 2023-05-30 MED ORDER — LISINOPRIL-HYDROCHLOROTHIAZIDE 10-12.5 MG PO TABS: 1.0000 | ORAL_TABLET | Freq: Every day | ORAL | 4 refills | Status: AC

## 2023-05-30 MED ORDER — GLIPIZIDE 5 MG PO TABS: 5.0000 mg | ORAL_TABLET | Freq: Every day | ORAL | 4 refills | Status: AC

## 2023-05-30 MED ORDER — ROSUVASTATIN CALCIUM 40 MG PO TABS: 40.0000 mg | ORAL_TABLET | Freq: Every day | ORAL | 4 refills | Status: AC

## 2023-06-14 ENCOUNTER — Other Ambulatory Visit: Payer: Self-pay | Admitting: Family Medicine

## 2023-06-14 DIAGNOSIS — F5101 Primary insomnia: Secondary | ICD-10-CM

## 2023-06-14 NOTE — Telephone Encounter (Signed)
 Copied from CRM [id number]. Topic: Clinical - Medication Refill >> Jun 14, 2023  8:39 AM Antwanette L wrote: Most Recent Primary Care Visit:  Provider: Malva Limes  Department: [facility]  Visit Type: PHYSICAL  Date: 05/04/2023  Medication: LORazepam (ATIVAN) 1 MG tablet   Has the patient contacted their pharmacy? Yes   Is this the correct pharmacy for this prescription? Yes  [facility] [address] [place] [address] [place] [address] Phone: [phone] Fax: [phone]   Has the prescription been filled recently? No  Is the patient out of the medication? No.  Has the patient been seen for an appointment in the last year OR does the patient have an upcoming appointment? Yes. Last office visit wa 05/04/23  Can we respond through MyChart? No. Contact patient by phone [phone]  Agent: Please be advised that Rx refills may take up to 3 business days. We ask that you follow-up with your pharmacy.

## 2023-06-14 NOTE — Telephone Encounter (Signed)
 Requested medication (s) are due for refill today: Yes  Requested medication (s) are on the active medication list: Yes  Last refill:  03/18/23  Future visit scheduled: Yes  Notes to clinic:  Unable to refill per protocol, cannot delegate.      Requested Prescriptions  Pending Prescriptions Disp Refills   LORazepam (ATIVAN) 1 MG tablet 90 tablet 1    Sig: Take 1 tablet (1 mg total) by mouth every 8 (eight) hours as needed.     Not Delegated - Psychiatry: Anxiolytics/Hypnotics 2 Failed - 06/14/2023  4:38 PM      Failed - This refill cannot be delegated      Failed - Urine Drug Screen completed in last 360 days      Passed - Patient is not pregnant      Passed - Valid encounter within last 6 months    Recent Outpatient Visits           1 month ago Annual physical exam   [facility] Malva Limes, MD

## 2023-06-15 MED ORDER — LORAZEPAM 1 MG PO TABS: 1.0000 mg | ORAL_TABLET | Freq: Three times a day (TID) | ORAL | 2 refills | Status: DC | PRN

## 2023-07-03 ENCOUNTER — Other Ambulatory Visit: Payer: Self-pay | Admitting: Family Medicine

## 2023-07-03 DIAGNOSIS — E1169 Type 2 diabetes mellitus with other specified complication: Secondary | ICD-10-CM

## 2023-07-14 LAB — COLOGUARD: COLOGUARD: NEGATIVE

## 2023-07-15 ENCOUNTER — Encounter: Payer: Self-pay | Admitting: Family Medicine

## 2023-09-05 ENCOUNTER — Encounter: Payer: Self-pay | Admitting: Family Medicine

## 2023-09-05 ENCOUNTER — Ambulatory Visit: Admitting: Family Medicine

## 2023-09-05 VITALS — BP 115/77 | HR 75 | Ht 68.0 in | Wt 209.0 lb

## 2023-09-05 DIAGNOSIS — E1169 Type 2 diabetes mellitus with other specified complication: Secondary | ICD-10-CM | POA: Diagnosis not present

## 2023-09-05 DIAGNOSIS — E782 Mixed hyperlipidemia: Secondary | ICD-10-CM | POA: Diagnosis not present

## 2023-09-05 DIAGNOSIS — I10 Essential (primary) hypertension: Secondary | ICD-10-CM | POA: Diagnosis not present

## 2023-09-05 LAB — POCT GLYCOSYLATED HEMOGLOBIN (HGB A1C)
Est. average glucose Bld gHb Est-mCnc: 183
Hemoglobin A1C: 8 % — AB (ref 4.0–5.6)

## 2023-09-05 NOTE — Patient Instructions (Signed)
 Marland Kitchen  Please review the attached list of medications and notify my office if there are any errors.   . Please bring all of your medications to every appointment so we can make sure that our medication list is the same as yours.

## 2023-09-05 NOTE — Progress Notes (Signed)
 Established patient visit   Patient: David Baldwin   DOB: 1970/09/03   53 y.o. Male  MRN: [id number] Visit Date: 09/05/2023  Today's healthcare provider: Nancyann Perry, MD   Chief Complaint  Patient presents with   Medical Management of Chronic Issues    Follow-up T2DM and HTN   Subjective    HPI Follow up htn and diabetes. Feels well no complaints. Tolerating medications. Reports that cost of his Farxiga  went up by $60. No low sugars. Has cut back on soft drinks, but still has a couple every day.   Lab Results  Component Value Date   HGBA1C 8.0 (A) 09/05/2023   HGBA1C 8.0 (H) 05/04/2023   HGBA1C 8.2 (H) 12/20/2022   Wt Readings from Last 3 Encounters:  09/05/23 209 lb (94.8 kg)  05/04/23 214 lb (97.1 kg)  12/20/22 217 lb 9.6 oz (98.7 kg)     Medications: Outpatient Medications Prior to Visit  Medication Sig   FARXIGA  10 MG TABS tablet Take 1 tablet by mouth once daily   glipiZIDE  (GLUCOTROL ) 5 MG tablet Take 1 tablet (5 mg total) by mouth daily before breakfast.   glucose blood test strip 1 strip by Other route daily.   lisinopril -hydrochlorothiazide  (ZESTORETIC ) 10-12.5 MG tablet Take 1 tablet by mouth daily.   LORazepam  (ATIVAN ) 1 MG tablet Take 1 tablet (1 mg total) by mouth every 8 (eight) hours as needed.   metFORMIN  (GLUCOPHAGE -XR) 500 MG 24 hr tablet Take 2 tablets by mouth twice daily   Naproxen Sodium 220 MG CAPS Take 1 capsule by mouth daily as needed.   rosuvastatin  (CRESTOR ) 40 MG tablet Take 1 tablet (40 mg total) by mouth daily.   No facility-administered medications prior to visit.    Review of Systems  Constitutional:  Negative for appetite change, chills and fever.  Respiratory:  Negative for chest tightness, shortness of breath and wheezing.   Cardiovascular:  Negative for chest pain and palpitations.  Gastrointestinal:  Negative for abdominal pain, nausea and vomiting.       Objective    BP 115/77 (BP Location: Left Arm, Patient  Position: Sitting, Cuff Size: Normal)   Pulse 75   Ht 5' 8 (1.727 m)   Wt 209 lb (94.8 kg)   SpO2 98%   BMI 31.78 kg/m    Physical Exam   General appearance: Mildly obese male, cooperative and in no acute distress Head: Normocephalic, without obvious abnormality, atraumatic Respiratory: Respirations even and unlabored, normal respiratory rate Extremities: All extremities are intact.  Skin: Skin color, texture, turgor normal. No rashes seen  Psych: Appropriate mood and affect. Neurologic: Mental status: Alert, oriented to person, place, and time, thought content appropriate.   Results for orders placed or performed in visit on 09/05/23  POCT glycosylated hemoglobin (Hb A1C)  Result Value Ref Range   Hemoglobin A1C 8.0 (A) 4.0 - 5.6 %   Est. average glucose Bld gHb Est-mCnc 183     Assessment & Plan     1. Type 2 diabetes mellitus with other specified complication, without long-term current use of insulin (HCC) (Primary) A1c stuck at 8.0. Discussed adding another medications. He prefers to try to work more on improving diet. Plan on changing Farxiga  to Jardiance with next refill due to insurance formulary.   2. Primary hypertension Well controlled.  Continue current medications.    3. Hyperlipemia, mixed He is tolerating rosuvastatin  well with no adverse effects.     Return in about 4  months (around 01/05/2024) for Diabetes.         Nancyann Perry, MD  [facility] [phone] (phone) [phone] (fax)  [facility]

## 2023-11-04 ENCOUNTER — Other Ambulatory Visit: Payer: Self-pay | Admitting: Family Medicine

## 2023-11-04 DIAGNOSIS — E1169 Type 2 diabetes mellitus with other specified complication: Secondary | ICD-10-CM

## 2023-11-04 MED ORDER — EMPAGLIFLOZIN 25 MG PO TABS: 25.0000 mg | ORAL_TABLET | Freq: Every day | ORAL | 1 refills | Status: AC

## 2024-01-04 ENCOUNTER — Ambulatory Visit: Admitting: Family Medicine

## 2024-01-04 ENCOUNTER — Encounter: Payer: Self-pay | Admitting: Family Medicine

## 2024-01-04 VITALS — BP 112/70 | HR 77 | Wt 208.6 lb

## 2024-01-04 DIAGNOSIS — Z7984 Long term (current) use of oral hypoglycemic drugs: Secondary | ICD-10-CM

## 2024-01-04 DIAGNOSIS — E785 Hyperlipidemia, unspecified: Secondary | ICD-10-CM | POA: Diagnosis not present

## 2024-01-04 DIAGNOSIS — E1169 Type 2 diabetes mellitus with other specified complication: Secondary | ICD-10-CM

## 2024-01-04 LAB — POCT GLYCOSYLATED HEMOGLOBIN (HGB A1C)
Est. average glucose Bld gHb Est-mCnc: 186
Hemoglobin A1C: 8.1 % — AB (ref 4.0–5.6)

## 2024-01-04 MED ORDER — TIRZEPATIDE 2.5 MG/0.5ML ~~LOC~~ SOAJ: 2.5000 mg | SUBCUTANEOUS | 0 refills | Status: DC

## 2024-01-04 NOTE — Patient Instructions (Signed)
 David Baldwin  Please review the attached list of medications and notify my office if there are any errors.   . Please bring all of your medications to every appointment so we can make sure that our medication list is the same as yours.

## 2024-01-04 NOTE — Progress Notes (Signed)
 Established patient visit   Patient: David Baldwin   DOB: [birth year]   53 y.o. Male  MRN: [id number] Visit Date: 01/04/2024  Today's healthcare provider: Nancyann Perry, MD   Chief Complaint  Patient presents with   Medical Management of Chronic Issues    T2DM  Patient declined pneumonia vaccine.   Subjective    Discussed the use of AI scribe software for clinical note transcription with the patient, who gave verbal consent to proceed.  History of Present Illness   David Baldwin is a 53 year old male with diabetes mellitus who presents for a follow-up to check his blood sugar levels.  He has not been monitoring his blood sugar levels at home. He is currently taking metformin  and Jardiance . He experiences increased thirst and frequent urination, which he associates with taking Jardiance .  His most recent A1c level is 8.1.  No history of hypoglycemia.  No family history of multiple endocrine neoplasia (MEN) or related cancers.       Medications: Outpatient Medications Prior to Visit  Medication Sig   empagliflozin  (JARDIANCE ) 25 MG TABS tablet Take 1 tablet (25 mg total) by mouth daily before breakfast. TAKING IN PLACE OF FARXIGA    glipiZIDE  (GLUCOTROL ) 5 MG tablet Take 1 tablet (5 mg total) by mouth daily before breakfast.   glucose blood test strip 1 strip by Other route daily.   lisinopril -hydrochlorothiazide  (ZESTORETIC ) 10-12.5 MG tablet Take 1 tablet by mouth daily.   LORazepam  (ATIVAN ) 1 MG tablet Take 1 tablet (1 mg total) by mouth every 8 (eight) hours as needed.   metFORMIN  (GLUCOPHAGE -XR) 500 MG 24 hr tablet Take 2 tablets by mouth twice daily   Naproxen Sodium 220 MG CAPS Take 1 capsule by mouth daily as needed.   rosuvastatin  (CRESTOR ) 40 MG tablet Take 1 tablet (40 mg total) by mouth daily.   No facility-administered medications prior to visit.   Review of Systems  Constitutional:  Negative for appetite change, chills and fever.  Respiratory:   Negative for chest tightness, shortness of breath and wheezing.   Cardiovascular:  Negative for chest pain and palpitations.  Gastrointestinal:  Negative for abdominal pain, nausea and vomiting.       Objective    BP 112/70 (BP Location: Left Arm, Patient Position: Sitting, Cuff Size: Normal)   Pulse 77   Wt 208 lb 9.6 oz (94.6 kg)   SpO2 96%   BMI 31.72 kg/m   Physical Exam   General appearance: Mildly obese male, cooperative and in no acute distress Head: Normocephalic, without obvious abnormality, atraumatic Respiratory: Respirations even and unlabored, normal respiratory rate Extremities: All extremities are intact.  Skin: Skin color, texture, turgor normal. No rashes seen  Psych: Appropriate mood and affect. Neurologic: Mental status: Alert, oriented to person, place, and time, thought content appropriate.   Results for orders placed or performed in visit on 01/04/24  POCT glycosylated hemoglobin (Hb A1C)  Result Value Ref Range   Hemoglobin A1C 8.1 (A) 4.0 - 5.6 %   Est. average glucose Bld gHb Est-mCnc 186      Assessment & Plan        Type 2 diabetes mellitus with hyperlipidemia A1c at 8.1% indicates suboptimal control. Current regimen includes metformin  and Jardiance . Proposed Mounjaro for better glycemic control and weight loss potential. Discussed side effects and insurance coverage. - Initiate Mounjaro (tirzepatide) 2.5 mg once weekly via injection. Educated on administration and potential side effects, including nausea, gastroparesis, and pancreatitis. -  Continue metformin  and Jardiance  initially. Reassess and potentially discontinue Jardiance  as glycemic control improves with Mounjaro. - Schedule follow-up in 3-4 months to assess response to Mounjaro and adjust treatment as necessary.  He is tolerating rosuvastatin  for lipid management well with no adverse effects.    General Health Maintenance Discussed in context of diabetes management and follow-up  scheduling. - Schedule a physical exam around the end of April, coinciding with diabetes follow-up.    Return in about 17 weeks (around 05/02/2024) for Yearly Physical.     Nancyann Perry, MD  [facility] [phone] (phone) [phone] (fax)  [facility]

## 2024-01-06 ENCOUNTER — Telehealth: Payer: Self-pay | Admitting: Pharmacy Technician

## 2024-01-06 ENCOUNTER — Other Ambulatory Visit (HOSPITAL_COMMUNITY): Payer: Self-pay

## 2024-01-06 NOTE — Telephone Encounter (Signed)
 Pharmacy Patient Advocate Encounter  Received notification from [facility] that Prior Authorization for Mounjaro 2.5MG /0.5ML auto-injectors has been APPROVED from 01/04/24 to 01/03/27   PA #/Case ID/Reference #: [id number]

## 2024-01-30 ENCOUNTER — Other Ambulatory Visit: Payer: Self-pay | Admitting: Family Medicine

## 2024-01-30 DIAGNOSIS — E1169 Type 2 diabetes mellitus with other specified complication: Secondary | ICD-10-CM

## 2024-01-30 DIAGNOSIS — F5101 Primary insomnia: Secondary | ICD-10-CM

## 2024-01-31 ENCOUNTER — Other Ambulatory Visit: Payer: Self-pay | Admitting: Family Medicine

## 2024-02-20 MED ORDER — TIRZEPATIDE 5 MG/0.5ML ~~LOC~~ SOAJ: 5.0000 mg | SUBCUTANEOUS | 1 refills | Status: AC

## 2024-04-09 LAB — OPHTHALMOLOGY REPORT-SCANNED

## 2024-06-25 ENCOUNTER — Encounter: Admitting: Family Medicine
